# Patient Record
Sex: Female | Born: 1955
Health system: Southern US, Community
[De-identification: ages and names within clinical notes are randomized; demographics above are authoritative.]

## PROBLEM LIST (undated history)

## (undated) DIAGNOSIS — D649 Anemia, unspecified: Secondary | ICD-10-CM

## (undated) DIAGNOSIS — F329 Major depressive disorder, single episode, unspecified: Secondary | ICD-10-CM

## (undated) DIAGNOSIS — E119 Type 2 diabetes mellitus without complications: Secondary | ICD-10-CM

## (undated) DIAGNOSIS — F32A Depression, unspecified: Secondary | ICD-10-CM

## (undated) DIAGNOSIS — M1990 Unspecified osteoarthritis, unspecified site: Secondary | ICD-10-CM

## (undated) DIAGNOSIS — H269 Unspecified cataract: Secondary | ICD-10-CM

## (undated) DIAGNOSIS — T7840XA Allergy, unspecified, initial encounter: Secondary | ICD-10-CM

## (undated) DIAGNOSIS — I10 Essential (primary) hypertension: Secondary | ICD-10-CM

## (undated) DIAGNOSIS — F419 Anxiety disorder, unspecified: Secondary | ICD-10-CM

## (undated) DIAGNOSIS — E785 Hyperlipidemia, unspecified: Secondary | ICD-10-CM

## (undated) HISTORY — DX: Anemia, unspecified: D64.9

## (undated) HISTORY — DX: Hyperlipidemia, unspecified: E78.5

## (undated) HISTORY — DX: Depression, unspecified: F32.A

## (undated) HISTORY — DX: Essential (primary) hypertension: I10

## (undated) HISTORY — DX: Allergy, unspecified, initial encounter: T78.40XA

## (undated) HISTORY — PX: TUBAL LIGATION: SHX77

## (undated) HISTORY — PX: UPPER GASTROINTESTINAL ENDOSCOPY: SHX188

## (undated) HISTORY — DX: Unspecified osteoarthritis, unspecified site: M19.90

## (undated) HISTORY — DX: Type 2 diabetes mellitus without complications: E11.9

## (undated) HISTORY — DX: Anxiety disorder, unspecified: F41.9

## (undated) HISTORY — PX: COLONOSCOPY: SHX174

## (undated) HISTORY — DX: Unspecified cataract: H26.9

---
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
## 1984-02-09 HISTORY — PX: SHOULDER SURGERY: SHX246

## 2018-08-25 ENCOUNTER — Other Ambulatory Visit: Payer: Self-pay

## 2018-08-25 ENCOUNTER — Encounter: Payer: Self-pay | Admitting: Family Medicine

## 2018-08-25 ENCOUNTER — Ambulatory Visit (INDEPENDENT_AMBULATORY_CARE_PROVIDER_SITE_OTHER): Payer: Self-pay | Admitting: Family Medicine

## 2018-08-25 VITALS — BP 110/74 | HR 74 | Wt 135.0 lb

## 2018-08-25 DIAGNOSIS — I10 Essential (primary) hypertension: Secondary | ICD-10-CM

## 2018-08-25 DIAGNOSIS — F325 Major depressive disorder, single episode, in full remission: Secondary | ICD-10-CM

## 2018-08-25 DIAGNOSIS — Z1211 Encounter for screening for malignant neoplasm of colon: Secondary | ICD-10-CM

## 2018-08-25 DIAGNOSIS — E119 Type 2 diabetes mellitus without complications: Secondary | ICD-10-CM

## 2018-08-25 DIAGNOSIS — Z114 Encounter for screening for human immunodeficiency virus [HIV]: Secondary | ICD-10-CM

## 2018-08-25 DIAGNOSIS — Z1239 Encounter for other screening for malignant neoplasm of breast: Secondary | ICD-10-CM

## 2018-08-25 DIAGNOSIS — Z1159 Encounter for screening for other viral diseases: Secondary | ICD-10-CM

## 2018-08-25 MED ORDER — CITALOPRAM HYDROBROMIDE 20 MG PO TABS: 20.0000 mg | ORAL_TABLET | Freq: Every day | ORAL | 0 refills | Status: DC

## 2018-08-25 MED ORDER — LOSARTAN POTASSIUM 100 MG PO TABS: 100.0000 mg | ORAL_TABLET | Freq: Every day | ORAL | 3 refills | Status: DC

## 2018-08-25 MED ORDER — METFORMIN HCL 500 MG PO TABS: 500.0000 mg | ORAL_TABLET | Freq: Two times a day (BID) | ORAL | 3 refills | Status: DC

## 2018-08-25 NOTE — Progress Notes (Signed)
Subjective:   Patient ID: Stacy Nguyen    DOB: 03/15/1955, 63 y.o. female   MRN: [id number]  Stacy Nguyen is a 63 y.o. female with a history of T2DM, HTN here for   Establish Care - [place] to [place] closer to [place]. Staying with son and his wife. - works at [employer], works for [employer]. line work.  - T2DM - metformin  - HTN - losartan, amlodipine,  - depression - on citalopram 20mg  since ~2000. Was put on after divorce. Has tried to get off it before but made her dizzy. Never hospitalized. No psychiatry.  - no abnl mammogram or colonoscopy, due for repeat screenings - no alcohol, never smoker, no ID use  Diabetes, Type 2 - Last A1c none since last year, thinks it was in the 6s.  - Medications: metformin 500mg  BID - Compliance: good - Checking BG at home: yes, 120s - Diet: lots of fruits and vegetables. Eats 3 meals per day.  - Exercise: walks a lot at work - Eye exam: 2 years ago. Wears glasses. - Foot exam: due - Microalbumin: n/a - Statin: no - Denies symptoms of hypoglycemia, polyuria, polydipsia, numbness extremities, foot ulcers/trauma  Hypertension: - Medications: losartan 100mg , amlodipine 5mg  - Compliance: good - Checking BP at home: yes, 110-120s SBP - Denies any SOB, CP, vision changes, LE edema, medication SEs, or symptoms of hypotension - Diet: see above - Exercise: see above  Review of Systems:  Per HPI.  PMFSH, medications and smoking status reviewed.  Objective:   BP 110/74   Pulse 74   Wt 135 lb (61.2 kg)  Vitals and nursing note reviewed.  General: well nourished, well developed, in no acute distress with non-toxic appearance CV: regular rate and rhythm without murmurs, rubs, or gallops, no lower extremity edema Lungs: clear to auscultation bilaterally with normal work of breathing Skin: warm, dry, no rashes or lesions. Foot exam done without abnormalities. Extremities: warm and well perfused,  normal tone MSK: ROM grossly intact, strength intact, gait normal Neuro: Alert and oriented, speech normal  Assessment & Plan:   Diabetes mellitus without complication (HCC) As lab is closed, will have patient come for lab appt later for A1c and lipid panel. Refills provided for medications today. Foot exam done without abnormalities. Referral made for diabetic eye exam. Revisit statin use at f/u. F/u in one month.  HTN (hypertension) Low normal today with intermittent symptoms of dizziness. Will d/c amlodipine. Will obtain BMP. F/u in one month.  Health Maintenance Orders placed for mammogram and colonoscopy. Will obtain HIV, Hep C screening.  Orders Placed This Encounter  Procedures  . MM DIGITAL SCREENING BILATERAL    Standing Status:   Future    Standing Expiration Date:   08/25/2019    Order Specific Question:   Reason for Exam (SYMPTOM  OR DIAGNOSIS REQUIRED)    Answer:   screening for breast cancer    Order Specific Question:   Preferred imaging location?    [facility]  . Lipid Panel    Standing Status:   Future    Standing Expiration Date:   08/25/2019  . CBC    Standing Status:   Future    Standing Expiration Date:   08/25/2019  . Basic Metabolic Panel    Standing Status:   Future    Standing Expiration Date:   08/25/2019  . Hepatitis C antibody    Standing Status:  Future    Standing Expiration Date:   08/25/2019  . HIV antibody (with reflex)    Standing Status:   Future    Standing Expiration Date:   08/25/2019  . Ambulatory referral to Gastroenterology    Referral Priority:   Routine    Referral Type:   Consultation    Referral Reason:   Specialty Services Required    Number of Visits Requested:   1  . Ambulatory referral to Ophthalmology    Referral Priority:   Routine    Referral Type:   Consultation    Referral Reason:   Specialty Services Required    Requested Specialty:   Ophthalmology    Number of Visits Requested:   1   Meds ordered  this encounter  Medications  . metFORMIN (GLUCOPHAGE) 500 MG tablet    Sig: Take 1 tablet (500 mg total) by mouth 2 (two) times daily with a meal.    Dispense:  180 tablet    Refill:  3  . losartan (COZAAR) 100 MG tablet    Sig: Take 1 tablet (100 mg total) by mouth at bedtime.    Dispense:  90 tablet    Refill:  3  . citalopram (CELEXA) 20 MG tablet    Sig: Take 1 tablet (20 mg total) by mouth daily.    Dispense:  30 tablet    Refill:  0    Rory Percy, DO PGY-3, [facility] 08/25/2018 9:53 PM

## 2018-08-25 NOTE — Assessment & Plan Note (Addendum)
Low normal today with intermittent symptoms of dizziness. Will d/c amlodipine. Will obtain BMP. F/u in one month.

## 2018-08-25 NOTE — Patient Instructions (Signed)
It was great to see you!  Our plans for today:  - we refilled your medications.  - Stop taking the amlodipine. - come for a lab visit to get labs. - We placed orders for mammogram and colonoscopy - come back in one month.  Take care and seek immediate care sooner if you develop any concerns.   Dr. [facility]

## 2018-08-25 NOTE — Assessment & Plan Note (Signed)
As lab is closed, will have patient come for lab appt later for A1c and lipid panel. Refills provided for medications today. Foot exam done without abnormalities. Referral made for diabetic eye exam. Revisit statin use at f/u. F/u in one month.

## 2018-09-14 ENCOUNTER — Other Ambulatory Visit: Payer: Self-pay

## 2018-09-14 DIAGNOSIS — F325 Major depressive disorder, single episode, in full remission: Secondary | ICD-10-CM

## 2018-09-14 DIAGNOSIS — I10 Essential (primary) hypertension: Secondary | ICD-10-CM

## 2018-09-14 DIAGNOSIS — E119 Type 2 diabetes mellitus without complications: Secondary | ICD-10-CM

## 2018-09-14 DIAGNOSIS — Z114 Encounter for screening for human immunodeficiency virus [HIV]: Secondary | ICD-10-CM

## 2018-09-14 DIAGNOSIS — Z1159 Encounter for screening for other viral diseases: Secondary | ICD-10-CM

## 2018-09-15 LAB — HEPATITIS C ANTIBODY: Hep C Virus Ab: 0.1 s/co ratio (ref 0.0–0.9)

## 2018-09-15 LAB — BASIC METABOLIC PANEL
BUN/Creatinine Ratio: 15 (ref 12–28)
BUN: 13 mg/dL (ref 8–27)
CO2: 26 mmol/L (ref 20–29)
Calcium: 9.8 mg/dL (ref 8.7–10.3)
Chloride: 104 mmol/L (ref 96–106)
Creatinine, Ser: 0.86 mg/dL (ref 0.57–1.00)
GFR calc Af Amer: 83 mL/min/{1.73_m2} (ref >59)
GFR calc non Af Amer: 72 mL/min/{1.73_m2} (ref >59)
Glucose: 90 mg/dL (ref 65–99)
Potassium: 4.5 mmol/L (ref 3.5–5.2)
Sodium: 144 mmol/L (ref 134–144)

## 2018-09-15 LAB — LIPID PANEL
Chol/HDL Ratio: 3.1 ratio (ref 0.0–4.4)
Cholesterol, Total: 170 mg/dL (ref 100–199)
HDL: 55 mg/dL (ref >39)
LDL Calculated: 106 mg/dL — ABNORMAL HIGH (ref 0–99)
Triglycerides: 44 mg/dL (ref 0–149)
VLDL Cholesterol Cal: 9 mg/dL (ref 5–40)

## 2018-09-15 LAB — CBC
Hematocrit: 40.6 % (ref 34.0–46.6)
Hemoglobin: 12.9 g/dL (ref 11.1–15.9)
MCH: 27 pg (ref 26.6–33.0)
MCHC: 31.8 g/dL (ref 31.5–35.7)
MCV: 85 fL (ref 79–97)
Platelets: 178 10*3/uL (ref 150–450)
RBC: 4.78 x10E6/uL (ref 3.77–5.28)
RDW: 12.9 % (ref 11.7–15.4)
WBC: 4 10*3/uL (ref 3.4–10.8)

## 2018-09-15 LAB — HIV ANTIBODY (ROUTINE TESTING W REFLEX): HIV Screen 4th Generation wRfx: NONREACTIVE

## 2018-09-15 MED ORDER — ROSUVASTATIN CALCIUM 5 MG PO TABS: 5.0000 mg | ORAL_TABLET | Freq: Every day | ORAL | 3 refills | Status: DC

## 2018-09-15 NOTE — Addendum Note (Signed)
Addended by: Myles Gip on: 09/15/2018 04:37 PM   Modules accepted: Orders

## 2018-09-18 ENCOUNTER — Telehealth: Payer: Self-pay | Admitting: *Deleted

## 2018-09-18 ENCOUNTER — Other Ambulatory Visit: Payer: Self-pay | Admitting: Family Medicine

## 2018-09-18 MED ORDER — ATORVASTATIN CALCIUM 10 MG PO TABS: 10.0000 mg | ORAL_TABLET | Freq: Every day | ORAL | 3 refills | Status: DC

## 2018-09-18 NOTE — Telephone Encounter (Signed)
Pt calls back because the cholesterol med is "way too expensive since I dont have insurance".  She wants to know if there is something else she can do until she is able get insurance.  She states if she does not answer to please leave a message.  Christen Bame, CMA

## 2018-09-18 NOTE — Telephone Encounter (Signed)
Notes recorded by Valerie Roys, CMA on 09/18/2018 at 9:33 AM EDT  LM for patient to call back. Jazmin Hartsell,CMA   ------   Notes recorded by Rory Percy, DO on 09/15/2018 at 4:36 PM EDT  Left VM regarding results. Everything is normal except for her lipid panel is a little elevated. Based on her risk factors, it is recommended to start a statin. I will send this in to the pharmacy.

## 2018-09-18 NOTE — Telephone Encounter (Signed)
Spoke to pt. Changed to atorvastatin which is on [facility] $4 list.

## 2018-09-21 NOTE — Telephone Encounter (Signed)
Pt wonders if she can improve her LDL without taking atorvastatin.  She wonders if she started taking CoQ10 again if that would help.  To PCP to advise. Christen Bame, CMA

## 2018-09-22 NOTE — Telephone Encounter (Signed)
Attempted for 2nd time to reach pt. LVM of note that was left. Salvatore Marvel, CMA

## 2018-09-22 NOTE — Telephone Encounter (Signed)
Left VM explaining we don't have much data on CoQ10 in hyperlipidemia but the information we do have in small studies suggests it does not provide much benefit. The medications shown to have the most benefit to lowering cholesterol are statins. Asks to call the clinic if she has further questions. Happy to discuss with her.

## 2018-10-12 ENCOUNTER — Encounter: Payer: Self-pay | Admitting: Family Medicine

## 2018-10-12 ENCOUNTER — Other Ambulatory Visit: Payer: Self-pay

## 2018-10-12 ENCOUNTER — Ambulatory Visit (INDEPENDENT_AMBULATORY_CARE_PROVIDER_SITE_OTHER): Payer: PRIVATE HEALTH INSURANCE | Admitting: Family Medicine

## 2018-10-12 VITALS — BP 112/60 | HR 72 | Ht 63.0 in | Wt 133.0 lb

## 2018-10-12 DIAGNOSIS — E785 Hyperlipidemia, unspecified: Secondary | ICD-10-CM | POA: Insufficient documentation

## 2018-10-12 DIAGNOSIS — I10 Essential (primary) hypertension: Secondary | ICD-10-CM

## 2018-10-12 DIAGNOSIS — E119 Type 2 diabetes mellitus without complications: Secondary | ICD-10-CM | POA: Diagnosis not present

## 2018-10-12 LAB — POCT GLYCOSYLATED HEMOGLOBIN (HGB A1C): HbA1c, POC (controlled diabetic range): 5.9 % (ref 0.0–7.0)

## 2018-10-12 MED ORDER — METFORMIN HCL 500 MG PO TABS: 500.0000 mg | ORAL_TABLET | Freq: Every day | ORAL | 3 refills | Status: DC

## 2018-10-12 NOTE — Assessment & Plan Note (Signed)
Below goal today, A1c 5.9.  Will decrease metformin to 500 mg once daily.  Will recheck A1c in 6 months.

## 2018-10-12 NOTE — Assessment & Plan Note (Signed)
Started on statin a few days ago.  Tolerating okay.  Will recheck lipid panel and LFTs in 6 weeks.

## 2018-10-12 NOTE — Patient Instructions (Addendum)
It was great to see you!   Our plans for today:  -Stop taking the amlodipine. -Only take metformin 1 tablet once daily.  Recheck your A1c in 6 months. -Continue taking the cholesterol medication.  We will recheck your labs in about 6 weeks. -We will place an referral for an eye doctor, someone will call you with this appointment.  Take care and seek immediate care sooner if you develop any concerns.   Dr. [facility]

## 2018-10-12 NOTE — Progress Notes (Signed)
  Subjective:   Patient ID: Jadah Bobak    DOB: [birth year] diabetes 63 y.o. female   MRN: [id number]  Sophy Kiaraliz Rafuse is a 63 y.o. female with a history of HTN, diabetes here for   Diabetes, Type 2 - Last A1c unknown, thinks it was in the 6s. - Medications: Metformin 500 mg twice daily - Compliance: good - Checking BG at home: yes, 100-120s. Highest she has seen is 192. - Diet: 3 meals per day. Eating fruits and vegetables. Does drink a lot of juice. - Exercise: No formal exercise. Active during the day at work. Works at [employer].  Is on her feet a lot and loads boxes. - Eye exam: Ordered - Foot exam: UTD - Microalbumin: N/a - Statin: yes, just started taking a few days ago - Denies symptoms of hypoglycemia, polyuria, polydipsia, numbness extremities, foot ulcers/trauma  Hypertension: - Medications: Losartan 100 mg - Compliance: Has still been taking amlodipine 5 mg despite discontinuation at last visit - Checking BP at home: Yes, 110s SBP - Denies any SOB, CP, vision changes, LE edema, medication SEs, or symptoms of hypotension - Diet: see above - Exercise: see above  Review of Systems:  Per HPI.  [place], medications and smoking status reviewed.  Objective:   BP 112/60   Pulse 72   Ht 5\' 3"  (1.6 m)   Wt 133 lb (60.3 kg)   SpO2 97%   BMI 23.56 kg/m  Vitals and nursing note reviewed.  General: well nourished, well developed, in no acute distress with non-toxic appearance CV: regular rate and rhythm without murmurs, rubs, or gallops, no lower extremity edema Lungs: clear to auscultation bilaterally with normal work of breathing Skin: warm, dry, no rashes or lesions Extremities: warm and well perfused, normal tone MSK: ROM grossly intact, strength intact, gait normal Neuro: Alert and oriented, speech normal  Assessment & Plan:   Diabetes mellitus without complication (HCC) Below goal today, A1c 5.9.  Will decrease metformin to 500 mg once daily.   Will recheck A1c in 6 months.  HTN (hypertension) Continues to be low normal with intermittent symptoms of postural dizziness, patient did not discontinue amlodipine as discussed at prior visit.  Will have patient discontinue amlodipine today.  Follow-up in 6 weeks.  Hyperlipidemia Started on statin a few days ago.  Tolerating okay.  Will recheck lipid panel and LFTs in 6 weeks.  Orders Placed This Encounter  Procedures  . HgB A1c   Meds ordered this encounter  Medications  . metFORMIN (GLUCOPHAGE) 500 MG tablet    Sig: Take 1 tablet (500 mg total) by mouth daily with breakfast.    Dispense:  180 tablet    Refill:  Camp Dennison, DO PGY-3, [facility] 10/12/2018 4:08 PM

## 2018-10-12 NOTE — Assessment & Plan Note (Signed)
Continues to be low normal with intermittent symptoms of postural dizziness, patient did not discontinue amlodipine as discussed at prior visit.  Will have patient discontinue amlodipine today.  Follow-up in 6 weeks.

## 2018-11-16 ENCOUNTER — Ambulatory Visit
Admission: RE | Admit: 2018-11-16 | Discharge: 2018-11-16 | Disposition: A | Discharge status: Home / Self Care | Payer: PRIVATE HEALTH INSURANCE | Source: Ambulatory Visit | Attending: Family Medicine | Admitting: Family Medicine

## 2018-11-16 ENCOUNTER — Other Ambulatory Visit: Payer: Self-pay

## 2018-11-16 DIAGNOSIS — Z1239 Encounter for other screening for malignant neoplasm of breast: Secondary | ICD-10-CM

## 2018-11-26 NOTE — Progress Notes (Signed)
Subjective:   Patient ID: Stacy Nguyen    DOB: Jun 30, 1955, 63 y.o. female   MRN: [id number]  Stacy Nguyen is a 63 y.o. female with a history of HTN, HLD, type II diabetes here for   Diabetes, Type 2 - Last A1c 5.9 10/2018 - Medications: metformin 500mg  daily - Compliance: good - Checking BG at home: yes, blood sugars 90-180. - Diet: eats 3 meals per day. Eating fruits and vegetables.  - Exercise: walks a lot - Eye exam: due, ordered - Foot exam: UTD - Microalbumin: Due - Statin: yes - Pneumococcal vaccine: yes - Denies symptoms of hypoglycemia, polyuria, polydipsia, numbness extremities, foot ulcers/trauma  Hypertension: - Medications: losartan 100mg  daily - Compliance: good - Checking BP at home: yes 109-127 SBP - Denies any SOB, CP, vision changes, LE edema, medication SEs - some lightheadedness with walking. - Diet: see above - Exercise: see above  HLD - medications: Atorvastatin 10 mg daily - compliance: Good - medication SEs: None  Healthcare Maintenance - Vaccines: flu, tdap - Colonoscopy: due, referral placed - Mammogram: UTD - Pap Smear: due, never had abnormal - DEXA Scan: due - Lipid Panel: UTD   Review of Systems:  Per HPI.  Medications and smoking status reviewed.  Objective:   BP 122/60   Pulse 73   Ht 5\' 3"  (1.6 m)   Wt 136 lb (61.7 kg)   SpO2 97%   BMI 24.09 kg/m  Vitals and nursing note reviewed.  General: well nourished, well developed, elderly female in no acute distress with non-toxic appearance CV: no lower extremity edema Lungs: normal work of breathing Skin: warm, dry, no rashes or lesions  Assessment & Plan:   Diabetes mellitus without complication (HCC) V3X 5.6 today.  CBGs well-controlled, 90-180 without symptoms of hypoglycemia.  Urine microalbumin normal today.  Will continue current regimen for now.  Instructed to call eye doctor for updated eye exam.  Follow-up in 6 months for next A1c.  HTN  (hypertension) Continues with low normal blood pressures, frequently in 100s-110s with orthostatic symptoms.  Will decrease losartan to 50 mg daily.  Follow-up in 2 months for recheck.  Hyperlipidemia Started on statin at last visit.  Will obtain repeat lipid panel and LFTs today.  Healthcare maintenance Instructed to call and make appointments to update eye exam and colonoscopy, referral already placed.  Order placed for DEXA scan today.  Discussed cervical cancer screening, patient opted to update Pap smear, instructed to make appointment at next convenience.  Previously declined flu vaccine.  Orders Placed This Encounter  Procedures  . DG Bone Density    Standing Status:   Future    Standing Expiration Date:   01/26/2020    Order Specific Question:   Reason for Exam (SYMPTOM  OR DIAGNOSIS REQUIRED)    Answer:   estrogen deficiency    Order Specific Question:   Preferred imaging location?    Answer:   [facility]  . Lipid Panel    Order Specific Question:   Has the patient fasted?    Answer:   No  . Comprehensive metabolic panel    Order Specific Question:   Has the patient fasted?    Answer:   No  . POCT UA - Microalbumin  . HgB A1c   Meds ordered this encounter  Medications  . losartan (COZAAR) 50 MG tablet    Sig: Take 1 tablet (50 mg total) by mouth daily.    Dispense:  90 tablet  Refill:  3    Ellwood Dense, DO PGY-3, [facility] 11/27/2018 5:55 PM

## 2018-11-27 ENCOUNTER — Other Ambulatory Visit: Payer: Self-pay

## 2018-11-27 ENCOUNTER — Encounter: Payer: Self-pay | Admitting: Family Medicine

## 2018-11-27 ENCOUNTER — Ambulatory Visit (INDEPENDENT_AMBULATORY_CARE_PROVIDER_SITE_OTHER): Payer: PRIVATE HEALTH INSURANCE | Admitting: Family Medicine

## 2018-11-27 VITALS — BP 122/60 | HR 73 | Ht 63.0 in | Wt 136.0 lb

## 2018-11-27 DIAGNOSIS — I10 Essential (primary) hypertension: Secondary | ICD-10-CM

## 2018-11-27 DIAGNOSIS — E119 Type 2 diabetes mellitus without complications: Secondary | ICD-10-CM

## 2018-11-27 DIAGNOSIS — E785 Hyperlipidemia, unspecified: Secondary | ICD-10-CM | POA: Diagnosis not present

## 2018-11-27 DIAGNOSIS — Z0000 Encounter for general adult medical examination without abnormal findings: Secondary | ICD-10-CM | POA: Diagnosis not present

## 2018-11-27 LAB — POCT UA - MICROALBUMIN
Albumin/Creatinine Ratio, Urine, POC: <30
Creatinine, POC: 200 mg/dL
Microalbumin Ur, POC: 20 mg/L

## 2018-11-27 LAB — POCT GLYCOSYLATED HEMOGLOBIN (HGB A1C): HbA1c, POC (controlled diabetic range): 5.6 % (ref 0.0–7.0)

## 2018-11-27 MED ORDER — LOSARTAN POTASSIUM 50 MG PO TABS: 50.0000 mg | ORAL_TABLET | Freq: Every day | ORAL | 3 refills | Status: DC

## 2018-11-27 NOTE — Assessment & Plan Note (Addendum)
Instructed to call and make appointments to update eye exam and colonoscopy, referral already placed.  Order placed for DEXA scan today.  Discussed cervical cancer screening, patient opted to update Pap smear, instructed to make appointment at next convenience.  Previously declined flu vaccine.

## 2018-11-27 NOTE — Assessment & Plan Note (Signed)
Started on statin at last visit.  Will obtain repeat lipid panel and LFTs today.

## 2018-11-27 NOTE — Assessment & Plan Note (Addendum)
A1c 5.6 today.  CBGs well-controlled, 90-180 without symptoms of hypoglycemia.  Urine microalbumin normal today.  Will continue current regimen for now.  Instructed to call eye doctor for updated eye exam.  Follow-up in 6 months for next A1c.

## 2018-11-27 NOTE — Patient Instructions (Addendum)
It was great to see you!  Our plans for today:  - Call [facility] at  [phone] to schedule your colonoscopy. - Make an appointment to update your pap smear. - Call the eye doctor to schedule an eye exam. - We are decreasing your losartan to 75mg  daily. - We are updating your bone density scan to check for osteoporosis.   We are checking some labs today, we will call you or send you a letter if they are abnormal.   Take care and seek immediate care sooner if you develop any concerns.   Dr. [facility]

## 2018-11-27 NOTE — Assessment & Plan Note (Signed)
Continues with low normal blood pressures, frequently in 100s-110s with orthostatic symptoms.  Will decrease losartan to 50 mg daily.  Follow-up in 2 months for recheck.

## 2018-11-28 ENCOUNTER — Telehealth: Payer: Self-pay | Admitting: Family Medicine

## 2018-11-28 ENCOUNTER — Encounter: Payer: Self-pay | Admitting: Family Medicine

## 2018-11-28 LAB — COMPREHENSIVE METABOLIC PANEL
ALT: 13 IU/L (ref 0–32)
AST: 14 IU/L (ref 0–40)
Albumin/Globulin Ratio: 2 (ref 1.2–2.2)
Albumin: 4.2 g/dL (ref 3.8–4.8)
Alkaline Phosphatase: 69 IU/L (ref 39–117)
BUN/Creatinine Ratio: 19 (ref 12–28)
BUN: 19 mg/dL (ref 8–27)
Bilirubin Total: 0.2 mg/dL (ref 0.0–1.2)
CO2: 27 mmol/L (ref 20–29)
Calcium: 9.5 mg/dL (ref 8.7–10.3)
Chloride: 105 mmol/L (ref 96–106)
Creatinine, Ser: 0.98 mg/dL (ref 0.57–1.00)
GFR calc Af Amer: 71 mL/min/{1.73_m2} (ref >59)
GFR calc non Af Amer: 61 mL/min/{1.73_m2} (ref >59)
Globulin, Total: 2.1 g/dL (ref 1.5–4.5)
Glucose: 95 mg/dL (ref 65–99)
Potassium: 4.8 mmol/L (ref 3.5–5.2)
Sodium: 141 mmol/L (ref 134–144)
Total Protein: 6.3 g/dL (ref 6.0–8.5)

## 2018-11-28 LAB — LIPID PANEL
Chol/HDL Ratio: 2.4 ratio (ref 0.0–4.4)
Cholesterol, Total: 130 mg/dL (ref 100–199)
HDL: 54 mg/dL (ref >39)
LDL Chol Calc (NIH): 66 mg/dL (ref 0–99)
Triglycerides: 41 mg/dL (ref 0–149)
VLDL Cholesterol Cal: 10 mg/dL (ref 5–40)

## 2018-11-28 NOTE — Telephone Encounter (Signed)
Patient stopped by seen was seen yesterday on 11/27/18.  She needs note to go back to work.  She saw Dr. Ky Barban on Monday.  Also she needs print out of form at the end of her visit she left it behind.  Patient's [phone].

## 2018-11-28 NOTE — Telephone Encounter (Signed)
Will forward to Md. Lindsee Labarre,CMA  

## 2018-11-28 NOTE — Telephone Encounter (Signed)
Letters written and sent to admin pool for printing and pickup by patient.

## 2018-11-29 NOTE — Telephone Encounter (Signed)
Lm for patient that letter will be ready for pick up at the front desk later today.  Jazmin Hartsell,CMA

## 2018-12-12 ENCOUNTER — Encounter: Payer: Self-pay | Admitting: Gastroenterology

## 2018-12-28 ENCOUNTER — Other Ambulatory Visit: Payer: Self-pay

## 2018-12-28 ENCOUNTER — Ambulatory Visit (AMBULATORY_SURGERY_CENTER): Payer: Self-pay | Admitting: *Deleted

## 2018-12-28 VITALS — Temp 97.5°F | Ht 61.0 in | Wt 132.2 lb

## 2018-12-28 DIAGNOSIS — Z1211 Encounter for screening for malignant neoplasm of colon: Secondary | ICD-10-CM

## 2018-12-28 DIAGNOSIS — Z1159 Encounter for screening for other viral diseases: Secondary | ICD-10-CM

## 2018-12-28 MED ORDER — SUPREP BOWEL PREP KIT 17.5-3.13-1.6 GM/177ML PO SOLN: 1.0000 | Freq: Once | ORAL | 0 refills | Status: AC

## 2018-12-28 NOTE — Progress Notes (Signed)
Patient denies any allergies to egg or soy products. Patient denies complications with anesthesia/sedation.  Patient denies oxygen use at home and denies diet medications. Emmi instructions for colonoscopy/endoscopy explained and given to patient.  Suprep coupon given.   

## 2019-01-08 ENCOUNTER — Other Ambulatory Visit (HOSPITAL_COMMUNITY)
Admission: RE | Admit: 2019-01-08 | Discharge: 2019-01-08 | Disposition: A | Discharge status: Home / Self Care | Payer: PRIVATE HEALTH INSURANCE | Source: Ambulatory Visit | Attending: Family Medicine | Admitting: Family Medicine

## 2019-01-08 ENCOUNTER — Other Ambulatory Visit: Payer: Self-pay

## 2019-01-08 ENCOUNTER — Encounter: Payer: Self-pay | Admitting: Family Medicine

## 2019-01-08 ENCOUNTER — Ambulatory Visit (INDEPENDENT_AMBULATORY_CARE_PROVIDER_SITE_OTHER): Payer: PRIVATE HEALTH INSURANCE | Admitting: Family Medicine

## 2019-01-08 VITALS — BP 130/70 | HR 69 | Ht 61.0 in | Wt 133.2 lb

## 2019-01-08 DIAGNOSIS — Z124 Encounter for screening for malignant neoplasm of cervix: Secondary | ICD-10-CM | POA: Diagnosis not present

## 2019-01-08 DIAGNOSIS — Z0000 Encounter for general adult medical examination without abnormal findings: Secondary | ICD-10-CM | POA: Diagnosis not present

## 2019-01-08 NOTE — Patient Instructions (Signed)
It was great to see you!  Our plans for today:  - We updated your Pap smear today, we will call you or send you a letter with these results. - I will check with our lab technician about insurance coverage for a stool card to check for colon cancer. - It would be a good idea to reach out to the Social Security office to see about signing up for Medicare once you turn 63 years old.  Take care and seek immediate care sooner if you develop any concerns.   Dr. [facility]

## 2019-01-08 NOTE — Progress Notes (Signed)
  Subjective:   Patient ID: Stacy Nguyen    DOB: 03-05-1955, 63 y.o. female   MRN: [id number]  Stacy Nguyen is a 63 y.o. female with a history of HTN, diabetes, HLD here for   Need for pap smear - [id number] - Menses: postmenopausal - Contraception: n/a - Cancer screening: due, denies previous abnormal - no abnormal discharge or bleeding.  - not sexually active.   Healthcare Maintenance - Vaccines: tdap, flu (declined) - Colonoscopy: due - Mammogram: UTD - Pap Smear: due - DEXA Scan: ordered  Review of Systems:  Per HPI.  Medications and smoking status reviewed.  Objective:   BP 130/70   Pulse 69   Ht 5\' 1"  (1.549 m)   Wt 133 lb 4 oz (60.4 kg)   LMP  (LMP Unknown)   BMI 25.18 kg/m  Vitals and nursing note reviewed.  General: well nourished, well developed, in no acute distress with non-toxic appearance GYN:  External genitalia within normal limits.  Vaginal mucosa pink, moist, normal rugae.  Nonfriable, atrophic cervix without lesions, no discharge or bleeding noted on speculum exam.  Bimanual exam revealed normal, nongravid uterus.  No cervical motion tenderness. No adnexal masses bilaterally.    Assessment & Plan:   Healthcare maintenance Pap smear obtained today, will call with results. Patient reports difficulties obtaining colonoscopy due to poor insurance coverage. Will reach out to lab tech to see if insurance will cover annual FOBT. Advised patient to apply for Medicare a few months before turning 64yo. DEXA ordered.   No orders of the defined types were placed in this encounter.  No orders of the defined types were placed in this encounter.   Rory Percy, DO PGY-3, [facility] 01/08/2019 6:00 PM

## 2019-01-08 NOTE — Assessment & Plan Note (Signed)
Pap smear obtained today, will call with results. Patient reports difficulties obtaining colonoscopy due to poor insurance coverage. Will reach out to lab tech to see if insurance will cover annual FOBT. Advised patient to apply for Medicare a few months before turning 64yo. DEXA ordered.

## 2019-01-10 LAB — CYTOLOGY - PAP
Comment: NEGATIVE
Diagnosis: NEGATIVE
High risk HPV: NEGATIVE

## 2019-01-11 ENCOUNTER — Encounter: Payer: PRIVATE HEALTH INSURANCE | Admitting: Gastroenterology

## 2019-01-18 ENCOUNTER — Other Ambulatory Visit: Payer: Self-pay | Admitting: Family Medicine

## 2019-01-19 NOTE — Telephone Encounter (Signed)
Pt returning call to Dr. Ky Barban.   States that Dr. Ky Barban wanted to know if she was taking citalopram.  Pt states that she is and would like a refill.  Christen Bame, CMA

## 2019-02-08 ENCOUNTER — Ambulatory Visit
Admission: RE | Admit: 2019-02-08 | Discharge: 2019-02-08 | Disposition: A | Discharge status: Home / Self Care | Payer: PRIVATE HEALTH INSURANCE | Source: Ambulatory Visit | Attending: Family Medicine | Admitting: Family Medicine

## 2019-02-08 ENCOUNTER — Other Ambulatory Visit: Payer: Self-pay

## 2019-02-08 DIAGNOSIS — E119 Type 2 diabetes mellitus without complications: Secondary | ICD-10-CM

## 2019-02-13 ENCOUNTER — Telehealth (INDEPENDENT_AMBULATORY_CARE_PROVIDER_SITE_OTHER): Payer: PRIVATE HEALTH INSURANCE | Admitting: Family Medicine

## 2019-02-13 ENCOUNTER — Other Ambulatory Visit: Payer: Self-pay

## 2019-02-13 DIAGNOSIS — R519 Headache, unspecified: Secondary | ICD-10-CM

## 2019-02-13 DIAGNOSIS — Z20822 Contact with and (suspected) exposure to covid-19: Secondary | ICD-10-CM | POA: Insufficient documentation

## 2019-02-13 DIAGNOSIS — R0989 Other specified symptoms and signs involving the circulatory and respiratory systems: Secondary | ICD-10-CM

## 2019-02-13 DIAGNOSIS — R6883 Chills (without fever): Secondary | ICD-10-CM | POA: Diagnosis not present

## 2019-02-13 NOTE — Progress Notes (Signed)
[facility] Telemedicine Visit  Patient consented to have virtual visit. Method of visit: Telephone  Encounter participants: Patient: Stacy Nguyen - located at home  Provider: Derrel Nip - located at home    Chief Complaint: cold symptoms   HPI:  Patient reports that symptoms started yesterday morning 1/4.  They consist of a runny nose, chills and a headache.  She says that with the chills she is thought that she had a fever but has checked her temperature and it is always been around 98 F.  Patient denies any sore throat, cough, nausea, vomiting, diarrhea, constipation, shortness of breath, body aches, loss of taste or smell.  She says that she has been treating this by drinking plenty of fluids including water and emergency vitamin mix.  She is also been taking Tylenol 1000 mg every 6 hours as needed as well as Coricidin HBP and Zicam nasal swabs.  Patient denies any recent sick contacts although she notes she works at [employer] and is not sure if anyone there is sick.  Patient lives at home with her son and daughter-in-law as well as grandchild.  No one else in the house is sick at this time.  Patient reports she is quarantining in her room at this time.  ROS: per HPI  Pertinent PMHx: Hypertension  Exam:  Respiratory: Speaking in complete sentences, no noticeable shortness of breath.  She does have what sounds like nasal congestion  Assessment/Plan:  Suspected COVID-19 virus infection Patient with symtpoms concerning for covid-19. Patient reports runny nose, chills, headache.  Denies sore throat, cough, nausea, vomiting, diarrhea, constipation, shortness of breath, body aches, loss of taste and smell.  Patient works at [employer] and is unsure if she has had any sick contacts but notes that she does not know of any.  Would be reasonable to test given symptoms.   -Counseled to continue symptomatic management with Tylenol and  Coricidin. -Recommended discontinuing Zicam nasal swabs -Ordered [id number] for testing and instructed patient to call to schedule COVID test  -Counseled on wearing a mask, washing hands and avoiding social gatherings  -ED precautions discussed and patient expressed good understanding -Patient instructed to avoid others until they meet criteria for ending isolation after any suspected COVID, which are:  -24 hours with no fever (without use of medicaitons) and -respiratory symptoms have improved (e.g. cough, shortness of breath) or  -10 days since symptoms first appeared       Time spent during visit with patient: 22 minutes

## 2019-02-13 NOTE — Assessment & Plan Note (Signed)
Patient with symtpoms concerning for covid-19. Patient reports runny nose, chills, headache.  Denies sore throat, cough, nausea, vomiting, diarrhea, constipation, shortness of breath, body aches, loss of taste and smell.  Patient works at [employer] and is unsure if she has had any sick contacts but notes that she does not know of any.  Would be reasonable to test given symptoms.   -Counseled to continue symptomatic management with Tylenol and Coricidin. -Recommended discontinuing Zicam nasal swabs -Ordered [id number] for testing and instructed patient to call to schedule COVID test  -Counseled on wearing a mask, washing hands and avoiding social gatherings  -ED precautions discussed and patient expressed good understanding -Patient instructed to avoid others until they meet criteria for ending isolation after any suspected COVID, which are:  -24 hours with no fever (without use of medicaitons) and -respiratory symptoms have improved (e.g. cough, shortness of breath) or  -10 days since symptoms first appeared

## 2019-02-13 NOTE — Patient Instructions (Signed)
We recommend you undergo testing for COVID19. This is by appointment only. There are three locations and hours vary.   Text "COVID" to [id number] OR log onto [email or web address].  You can also call [phone].   There are three locations for testing, hours vary.   [facility] [place] [address] [place], [place] [address] Hours 8 AM until 3:30 PM  or [facility] [place] ([place]) [address] [place], [place] [address] or [address] (near [address] in [place]) [place], [place] [address]   You should quarantine (isolate at home) until the following criteria are met: At least 10 days since symptoms first appeared and At least 24 hours with no fever without fever-reducing medication and Other symptoms of COVID-19 are improving (Loss of taste and smell may persist for weeks or months after recovery and need not delay the end of isolation)  When to seek emergency medical attention Look for emergency warning signs* for COVID-19. If your or someone you know is showing any of these signs, seek emergency medical care immediately:  Trouble breathing Persistent pain or pressure in the chest New confusion Inability to wake or stay awake Bluish lips or face Inability to tolerate fluids  *This list is not all possible symptoms. We discussed additional symptoms   Call 911 or call ahead to your local emergency facility: Notify the operator that you are seeking care for someone who has or may have COVID-19.  

## 2019-02-14 ENCOUNTER — Ambulatory Visit: Payer: PRIVATE HEALTH INSURANCE | Attending: Internal Medicine

## 2019-02-14 DIAGNOSIS — Z20822 Contact with and (suspected) exposure to covid-19: Secondary | ICD-10-CM

## 2019-02-16 ENCOUNTER — Encounter: Payer: Self-pay | Admitting: Family Medicine

## 2019-02-16 ENCOUNTER — Telehealth: Payer: Self-pay | Admitting: Family Medicine

## 2019-02-16 LAB — NOVEL CORONAVIRUS, NAA: SARS-CoV-2, NAA: NOT DETECTED

## 2019-02-16 NOTE — Telephone Encounter (Signed)
Pt needs a note stating when she can return to work. Pt saw her negative covid results, but needs note for work  Ok to put on [employer]

## 2019-02-25 NOTE — Telephone Encounter (Signed)
Routing to provider who saw patient

## 2019-02-26 ENCOUNTER — Encounter: Payer: Self-pay | Admitting: Family Medicine

## 2019-02-26 NOTE — Progress Notes (Signed)
I sent a return to work note to the patients mychart.

## 2019-07-30 ENCOUNTER — Other Ambulatory Visit: Payer: Self-pay | Admitting: Family Medicine

## 2019-10-16 ENCOUNTER — Other Ambulatory Visit: Payer: Self-pay

## 2019-10-17 MED ORDER — ATORVASTATIN CALCIUM 10 MG PO TABS: 10.0000 mg | ORAL_TABLET | Freq: Every day | ORAL | 3 refills | Status: DC

## 2019-10-17 MED ORDER — CITALOPRAM HYDROBROMIDE 20 MG PO TABS: 20.0000 mg | ORAL_TABLET | Freq: Every day | ORAL | 0 refills | Status: DC

## 2019-10-26 ENCOUNTER — Other Ambulatory Visit: Payer: Self-pay | Admitting: Family Medicine

## 2019-10-26 DIAGNOSIS — Z1231 Encounter for screening mammogram for malignant neoplasm of breast: Secondary | ICD-10-CM

## 2019-11-18 ENCOUNTER — Other Ambulatory Visit: Payer: Self-pay | Admitting: Family Medicine

## 2019-11-20 ENCOUNTER — Other Ambulatory Visit: Payer: Self-pay

## 2019-11-20 ENCOUNTER — Ambulatory Visit
Admission: RE | Admit: 2019-11-20 | Discharge: 2019-11-20 | Disposition: A | Discharge status: Home / Self Care | Payer: Medicare HMO | Source: Ambulatory Visit | Attending: *Deleted | Admitting: *Deleted

## 2019-11-20 DIAGNOSIS — Z1231 Encounter for screening mammogram for malignant neoplasm of breast: Secondary | ICD-10-CM

## 2019-12-03 ENCOUNTER — Other Ambulatory Visit: Payer: Self-pay

## 2019-12-03 MED ORDER — METFORMIN HCL 500 MG PO TABS
500.0000 mg | ORAL_TABLET | Freq: Every day | ORAL | 3 refills | Status: DC
Start: 2019-12-03 — End: 2021-02-18

## 2020-01-21 ENCOUNTER — Other Ambulatory Visit: Payer: Self-pay

## 2020-01-21 ENCOUNTER — Ambulatory Visit
Admission: RE | Admit: 2020-01-21 | Discharge: 2020-01-21 | Disposition: A | Discharge status: Home / Self Care | Payer: Medicare HMO | Source: Ambulatory Visit | Attending: *Deleted | Admitting: *Deleted

## 2020-01-21 DIAGNOSIS — Z1231 Encounter for screening mammogram for malignant neoplasm of breast: Secondary | ICD-10-CM | POA: Diagnosis not present

## 2020-01-21 MED ORDER — LOSARTAN POTASSIUM 50 MG PO TABS
50.0000 mg | ORAL_TABLET | Freq: Every day | ORAL | 3 refills | Status: DC
Start: 2020-01-21 — End: 2021-01-12

## 2020-01-21 NOTE — Telephone Encounter (Signed)
Patient calls nurse line requesting refill on losartan. Patient reports that she took last pill today and is requesting refill as soon as possible. Advised patient of rx refill policy.   To PCP  Veronda Prude, RN

## 2020-01-28 ENCOUNTER — Other Ambulatory Visit: Payer: Self-pay | Admitting: *Deleted

## 2020-01-28 DIAGNOSIS — R928 Other abnormal and inconclusive findings on diagnostic imaging of breast: Secondary | ICD-10-CM

## 2020-02-06 ENCOUNTER — Other Ambulatory Visit: Payer: Self-pay | Admitting: Family Medicine

## 2020-02-06 ENCOUNTER — Other Ambulatory Visit: Payer: Self-pay | Admitting: Emergency Medicine

## 2020-02-06 ENCOUNTER — Other Ambulatory Visit: Payer: Self-pay | Admitting: Internal Medicine

## 2020-02-06 DIAGNOSIS — R928 Other abnormal and inconclusive findings on diagnostic imaging of breast: Secondary | ICD-10-CM

## 2020-02-13 ENCOUNTER — Ambulatory Visit: Payer: Medicare HMO

## 2020-02-13 ENCOUNTER — Ambulatory Visit
Admission: RE | Admit: 2020-02-13 | Discharge: 2020-02-13 | Disposition: A | Discharge status: Home / Self Care | Payer: Medicare HMO | Source: Ambulatory Visit | Attending: *Deleted | Admitting: *Deleted

## 2020-02-13 ENCOUNTER — Other Ambulatory Visit: Payer: Self-pay

## 2020-02-13 DIAGNOSIS — R922 Inconclusive mammogram: Secondary | ICD-10-CM | POA: Diagnosis not present

## 2020-02-13 DIAGNOSIS — R928 Other abnormal and inconclusive findings on diagnostic imaging of breast: Secondary | ICD-10-CM

## 2020-03-25 ENCOUNTER — Telehealth: Payer: Self-pay

## 2020-03-25 NOTE — Telephone Encounter (Signed)
Fax received from [facility] requesting the following:  [employer] True Metrix Test Strip True Metrix Blood Glucose MTR Tru Metrix Level 1 CTRL Soln  Please order if appropriate  Sunday Spillers, CMA

## 2020-03-25 NOTE — Telephone Encounter (Signed)
[employer] alos requested  Trueplus Super Think 28G lancet.  Sunday Spillers, CMA

## 2020-03-26 ENCOUNTER — Other Ambulatory Visit: Payer: Self-pay | Admitting: Family Medicine

## 2020-03-26 MED ORDER — TRUEPLUS LANCETS 28G MISC: 1.0000 | Freq: Four times a day (QID) | 3 refills | Status: AC

## 2020-04-02 ENCOUNTER — Ambulatory Visit (INDEPENDENT_AMBULATORY_CARE_PROVIDER_SITE_OTHER): Payer: Medicare HMO | Admitting: Family Medicine

## 2020-04-02 ENCOUNTER — Encounter: Payer: Self-pay | Admitting: Family Medicine

## 2020-04-02 ENCOUNTER — Other Ambulatory Visit: Payer: Self-pay

## 2020-04-02 VITALS — BP 132/80 | HR 67 | Wt 136.6 lb

## 2020-04-02 DIAGNOSIS — I10 Essential (primary) hypertension: Secondary | ICD-10-CM | POA: Diagnosis not present

## 2020-04-02 DIAGNOSIS — E119 Type 2 diabetes mellitus without complications: Secondary | ICD-10-CM | POA: Diagnosis not present

## 2020-04-02 DIAGNOSIS — E785 Hyperlipidemia, unspecified: Secondary | ICD-10-CM

## 2020-04-02 LAB — POCT GLYCOSYLATED HEMOGLOBIN (HGB A1C): Hemoglobin A1C: 5.4 % (ref 4.0–5.6)

## 2020-04-02 MED ORDER — DICLOFENAC SODIUM 1 % EX GEL: 2.0000 g | Freq: Four times a day (QID) | CUTANEOUS | 0 refills | Status: AC

## 2020-04-02 NOTE — Assessment & Plan Note (Signed)
A1c 5.4 today.  Congratulated patient on her efforts and encouraged her to keep up the great work.  Her goal is 7-8.  Stopped Metformin.  Recheck again in 3 to 6 months.

## 2020-04-02 NOTE — Assessment & Plan Note (Addendum)
BP at goal, 132/80.  Continue Cozaar at current dose.  Follow-up in 2 to 3 months.  Obtain BMP today

## 2020-04-02 NOTE — Assessment & Plan Note (Signed)
Obtained lipid panel today.  Continue atorvastatin 10 mg.

## 2020-04-02 NOTE — Patient Instructions (Addendum)
Thank you for coming to see me today. It was a pleasure. Today we discussed your diabetes. It is excellent and you not longer need metformin. I recommend stopping metformin.   I have sent you in Voltaren gel for your sternal bone pain.  Please follow-up with me in 2 weeks.  If you have any questions or concerns, please do not hesitate to call the office at [phone].  We will get some labs today.  If they are abnormal or we need to do something about them, I will call you.  If they are normal, I will send you a message on MyChart (if it is active) or a letter in the mail.  If you don't hear from [place] in 2 weeks, please call the office at the number below.   Best wishes,   Dr Allena Katz

## 2020-04-02 NOTE — Progress Notes (Signed)
     SUBJECTIVE:   CHIEF COMPLAINT / HPI:   Stacy Nguyen is a 65 y.o. female presents for physical    Diabetes Patient's current diabetic medications include metformin. Tolerating well without side effects.  Patient endorses compliance with these medications. CBG readings averaging in the 130-140 range.  Patient's last A1c was  Lab Results  Component Value Date   HGBA1C 5.4 04/02/2020   HGBA1C 5.6 11/27/2018   HGBA1C 5.9 10/12/2018  Denies abdominal pain, blurred vision, polyuria, polydipsia, hypoglycemia. Patient states they understand that diet and exercise can help with her diabetes.  Last Microalbumin, LDL, Creatinine: Lab Results  Component Value Date   MICROALBUR 20 11/27/2018   LDLCALC 66 11/27/2018   CREATININE 0.98 11/27/2018    HLD Patient is currently taking Atorvastatin. Endorses compliance and tolerating well without side effects. Denies RUQ pain or myalgias.  Hypertension: Patient's current antihypertensive  medications include: Cozaar. Compliant with medications and tolerating well without side effects.  Checking BP at home with readings between 112 and 168. Denies any SOB, CP, vision changes, LE edema, medication SEs, or symptoms of hypotension.   Most recent creatinine trend:  Lab Results  Component Value Date   CREATININE 0.98 11/27/2018   CREATININE 0.86 09/14/2018     Patient has not had a BMP in the past 1 year.    PERTINENT  PMH / PSH:   OBJECTIVE:   BP 132/80   Pulse 67   Wt 136 lb 9.6 oz (62 kg)   LMP  (LMP Unknown)   SpO2 97%   BMI 25.81 kg/m    General: Alert, no acute distress Cardio: Normal S1 and S2, RRR, no r/m/g Pulm: CTAB, normal work of breathing Abdomen: Bowel sounds normal. Abdomen soft and non-tender.  Extremities: No peripheral edema.  Neuro: Cranial nerves grossly intact   ASSESSMENT/PLAN:   HTN (hypertension) BP at goal, 132/80.  Continue Cozaar at current dose.  Follow-up in 2 to 3 months.  Obtain BMP  today  Hyperlipidemia Obtained lipid panel today.  Continue atorvastatin 10 mg.  Diabetes mellitus without complication (HCC) A1c 5.4 today.  Congratulated patient on her efforts and encouraged her to keep up the great work.  Her goal is 7-8.  Stopped Metformin.  Recheck again in 3 to 6 months.     Towanda Octave, MD PGY-2 [facility]

## 2020-04-03 LAB — LIPID PANEL
Chol/HDL Ratio: 2.5 ratio (ref 0.0–4.4)
Cholesterol, Total: 138 mg/dL (ref 100–199)
HDL: 55 mg/dL (ref >39)
LDL Chol Calc (NIH): 74 mg/dL (ref 0–99)
Triglycerides: 33 mg/dL (ref 0–149)
VLDL Cholesterol Cal: 9 mg/dL (ref 5–40)

## 2020-04-03 LAB — COMPREHENSIVE METABOLIC PANEL
ALT: 13 IU/L (ref 0–32)
AST: 15 IU/L (ref 0–40)
Albumin/Globulin Ratio: 1.7 (ref 1.2–2.2)
Albumin: 4.3 g/dL (ref 3.8–4.8)
Alkaline Phosphatase: 75 IU/L (ref 44–121)
BUN/Creatinine Ratio: 15 (ref 12–28)
BUN: 12 mg/dL (ref 8–27)
Bilirubin Total: <0.2 mg/dL (ref 0.0–1.2)
CO2: 25 mmol/L (ref 20–29)
Calcium: 9.5 mg/dL (ref 8.7–10.3)
Chloride: 106 mmol/L (ref 96–106)
Creatinine, Ser: 0.82 mg/dL (ref 0.57–1.00)
GFR calc Af Amer: 87 mL/min/{1.73_m2} (ref >59)
GFR calc non Af Amer: 75 mL/min/{1.73_m2} (ref >59)
Globulin, Total: 2.5 g/dL (ref 1.5–4.5)
Glucose: 94 mg/dL (ref 65–99)
Potassium: 4.2 mmol/L (ref 3.5–5.2)
Sodium: 145 mmol/L — ABNORMAL HIGH (ref 134–144)
Total Protein: 6.8 g/dL (ref 6.0–8.5)

## 2020-05-06 ENCOUNTER — Other Ambulatory Visit: Payer: Self-pay

## 2020-05-06 ENCOUNTER — Encounter: Payer: Self-pay | Admitting: Family Medicine

## 2020-05-06 ENCOUNTER — Ambulatory Visit (INDEPENDENT_AMBULATORY_CARE_PROVIDER_SITE_OTHER): Payer: Medicare HMO | Admitting: Family Medicine

## 2020-05-06 VITALS — BP 140/88 | HR 54 | Ht 61.0 in | Wt 136.0 lb

## 2020-05-06 DIAGNOSIS — K5900 Constipation, unspecified: Secondary | ICD-10-CM | POA: Diagnosis not present

## 2020-05-06 DIAGNOSIS — G8929 Other chronic pain: Secondary | ICD-10-CM | POA: Diagnosis not present

## 2020-05-06 DIAGNOSIS — M25562 Pain in left knee: Secondary | ICD-10-CM

## 2020-05-06 DIAGNOSIS — L989 Disorder of the skin and subcutaneous tissue, unspecified: Secondary | ICD-10-CM | POA: Diagnosis not present

## 2020-05-06 DIAGNOSIS — M25561 Pain in right knee: Secondary | ICD-10-CM | POA: Diagnosis not present

## 2020-05-06 DIAGNOSIS — M170 Bilateral primary osteoarthritis of knee: Secondary | ICD-10-CM

## 2020-05-06 DIAGNOSIS — K649 Unspecified hemorrhoids: Secondary | ICD-10-CM | POA: Diagnosis not present

## 2020-05-06 MED ORDER — HYDROCORTISONE (PERIANAL) 2.5 % EX CREA: 1.0000 "application " | TOPICAL_CREAM | Freq: Two times a day (BID) | CUTANEOUS | 0 refills | Status: DC

## 2020-05-06 MED ORDER — POLYETHYLENE GLYCOL 3350 17 GM/SCOOP PO POWD: 17.0000 g | Freq: Two times a day (BID) | ORAL | 1 refills | Status: DC | PRN

## 2020-05-06 NOTE — Patient Instructions (Addendum)
Thank you for coming to see me today. It was a pleasure. Today we discussed your knee pain. It is probably arthritis. I recommend some knee imaging. Keep taking tylenol for the pain and using the gel.   The lump-I do not think it is dangerous. Please book an appointment with the dermatology clinic.  I have placed an order for knee xray.  Please go to [facility] at [employer] [facility] or at [facility] to have this completed.  You do not need an appointment, but if you would like to call them beforehand, their number is [phone].  We will contact you with your results afterwards.  Please follow-up with me in 2-4 weeks   If you have any questions or concerns, please do not hesitate to call the office at [phone].  Best wishes,   Dr Allena Katz

## 2020-05-06 NOTE — Progress Notes (Addendum)
     SUBJECTIVE:   CHIEF COMPLAINT / HPI:   Stacy Nguyen is a 65 y.o. female presents for follow up   Lump on left forearm 0.5cm lump under skin, present for 1 year, following a bug bite. Has not increased in size. When she pinches it it hurts. Prior to the bug bite her skin was normal in that area. It bothers her when she messes with it.   Knee pain She stands on her feet all day. Her right knee locks regularly. Difficulty going up the stairs. Compensates by using left knee. Voltaren gel is helping. Did fall on right knee a few months ago.   Flowsheet Row Office Visit from 05/06/2020 in [facility]  PHQ-9 Total Score 1       Health Maintenance Due  Topic  . TETANUS/TDAP   . COLONOSCOPY (Pts 45-83yrs Insurance coverage will need to be confirmed)   . PNA vac Low Risk Adult (1 of 2 - PCV13)  . FOOT EXAM   . OPHTHALMOLOGY EXAM       PERTINENT  PMH / PSH:   OBJECTIVE:   BP 140/88   Pulse (!) 54   Ht 5\' 1"  (1.549 m)   Wt 136 lb (61.7 kg)   LMP  (LMP Unknown)   SpO2 98%   BMI 25.70 kg/m    General: Alert, no acute distress Cardio: Normal S1 and S2, RRR, no r/m/g Pulm: CTAB, normal work of breathing Extremities: No peripheral edema.  Knee: - Inspection: no gross deformity. No swelling/effusion, erythema or bruising. Skin intact - Palpation: mild TTP - ROM: full active ROM with flexion and extension in knee and hip, crepitus felt on active and passive flexion extension. - Strength: 5/5 strength - Neuro/vasc: NV intact - Special Tests: - LIGAMENTS: negative anterior and posterior drawer, negative Lachman's, no MCL or LCL laxity   Neuro: Cranial nerves grossly intact        ASSESSMENT/PLAN:   Knee pain Pt endorses chronic bilateral knee pain. Worse in left knee. Examination: no erythema, edema, no overlying skin changes. Tenderness on palpation of left knee joint. Crepitus on active and passive movement. Ordered bilateral knee  x-rays. Recommended continuing tylenol and Voltaren gel for knee pain.  Constipation Likely due to starting recent vitamins which contain iron. Prescribed miralax. Pt will also need age related cancer screening which we will discuss at next visit.   Hemorrhoid One external hemorrhoid on exam today likely due to constipation and recent straining. Prescribed anusol cream.   Skin lesion of right arm 0.5cm, round hyperpigmented nodule over right forearm. Pt reports this has been there since she was stung by a bee last year. No tenderness on palpation. It was not there prior to the bee sting. Likely scar tissue post bee sting. Also considered nevus, skin malignancy etc however lower on differential based on history.  It is bothering pt and she would like it removed. Referred pt to derm clinic for removal +/- biopsy if indicated.      , MD PGY-2 [facility]

## 2020-05-08 ENCOUNTER — Ambulatory Visit
Admission: RE | Admit: 2020-05-08 | Discharge: 2020-05-08 | Disposition: A | Discharge status: Home / Self Care | Payer: Medicare HMO | Source: Ambulatory Visit | Attending: Family Medicine | Admitting: Family Medicine

## 2020-05-08 ENCOUNTER — Other Ambulatory Visit: Payer: Self-pay

## 2020-05-08 DIAGNOSIS — M25562 Pain in left knee: Secondary | ICD-10-CM | POA: Diagnosis not present

## 2020-05-08 DIAGNOSIS — M170 Bilateral primary osteoarthritis of knee: Secondary | ICD-10-CM

## 2020-05-08 DIAGNOSIS — M25561 Pain in right knee: Secondary | ICD-10-CM | POA: Diagnosis not present

## 2020-05-09 ENCOUNTER — Telehealth: Payer: Self-pay | Admitting: Family Medicine

## 2020-05-09 NOTE — Telephone Encounter (Signed)
Left brief VM explaining xray results. Will follow up at next appointment later this month.

## 2020-05-10 ENCOUNTER — Other Ambulatory Visit: Payer: Self-pay | Admitting: Family Medicine

## 2020-05-13 DIAGNOSIS — M25569 Pain in unspecified knee: Secondary | ICD-10-CM | POA: Insufficient documentation

## 2020-05-13 DIAGNOSIS — L989 Disorder of the skin and subcutaneous tissue, unspecified: Secondary | ICD-10-CM | POA: Insufficient documentation

## 2020-05-13 DIAGNOSIS — K649 Unspecified hemorrhoids: Secondary | ICD-10-CM | POA: Insufficient documentation

## 2020-05-13 DIAGNOSIS — K5900 Constipation, unspecified: Secondary | ICD-10-CM | POA: Insufficient documentation

## 2020-05-13 NOTE — Assessment & Plan Note (Signed)
0.5cm, round hyperpigmented nodule over right forearm. Pt reports this has been there since she was stung by a bee last year. No tenderness on palpation. It was not there prior to the bee sting. Likely scar tissue post bee sting. Also considered nevus, skin malignancy etc however lower on differential based on history.  It is bothering pt and she would like it removed. Referred pt to derm clinic for removal +/- biopsy if indicated.

## 2020-05-13 NOTE — Assessment & Plan Note (Signed)
Pt endorses chronic bilateral knee pain. Worse in left knee. Examination: no erythema, edema, no overlying skin changes. Tenderness on palpation of left knee joint. Crepitus on active and passive movement. Ordered bilateral knee x-rays. Recommended continuing tylenol and Voltaren gel for knee pain.

## 2020-05-13 NOTE — Assessment & Plan Note (Addendum)
Likely due to starting recent vitamins which contain iron. Prescribed miralax. Pt will also need age related cancer screening which we will discuss at next visit.

## 2020-05-13 NOTE — Assessment & Plan Note (Addendum)
One external hemorrhoid on exam today likely due to constipation and recent straining. Prescribed anusol cream.

## 2020-05-29 ENCOUNTER — Ambulatory Visit (INDEPENDENT_AMBULATORY_CARE_PROVIDER_SITE_OTHER): Payer: Medicare HMO | Admitting: Family Medicine

## 2020-05-29 ENCOUNTER — Other Ambulatory Visit: Payer: Self-pay

## 2020-05-29 VITALS — BP 118/75 | HR 74 | Ht 60.0 in | Wt 135.4 lb

## 2020-05-29 DIAGNOSIS — R229 Localized swelling, mass and lump, unspecified: Secondary | ICD-10-CM | POA: Diagnosis not present

## 2020-05-29 NOTE — Assessment & Plan Note (Signed)
Chronic, asymptomatic postinflammatory nodule, improving. Appears benign. Discussed management options including observation vs punch biopsy to remove. Patient opted for observation. Return precautions discussed.

## 2020-05-29 NOTE — Progress Notes (Signed)
   Subjective:   Patient ID: Stacy Nguyen    DOB: March 11, 1955, 65 y.o. female   MRN: [id number]  Stacy Nguyen is a 65 y.o. female  here for Skin lesion  HPI: Patient presents for further evaluation of spot on right forearm x ~5 months ago after insect bite to the area. Now the area has a nodule underlying that she just wants to make sure it isnt anything bad. Denies any pain, itching, erythema, discharge. Denies any history of skin cancer or other cancers.  Review of Systems:  Per HPI.   Objective:   BP 118/75   Pulse 74   Ht 5' (1.524 m)   Wt 135 lb 6.4 oz (61.4 kg)   LMP  (LMP Unknown)   SpO2 96%   BMI 26.44 kg/m  Vitals and nursing note reviewed.  General: pleasant older woman, sitting comfortably in exam chair, well nourished, well developed, in no acute distress with non-toxic appearance Resp: breathing comfortably on room air, speaking in full sentences Skin: warm, dry, 0.4cm subcutaneous round mobile round nodule on right anterior medial forearm with overlying hyperpigmentation, no surrounding erythema, warmth MSK:  gait normal Neuro: Alert and oriented, speech normal      Assessment & Plan:   Subcutaneous nodule Chronic, asymptomatic postinflammatory nodule, improving. Appears benign. Discussed management options including observation vs punch biopsy to remove. Patient opted for observation. Return precautions discussed.  No orders of the defined types were placed in this encounter.  No orders of the defined types were placed in this encounter.     Orpah Cobb, DO PGY-3, [facility] 05/29/2020 3:03 PM

## 2020-05-29 NOTE — Patient Instructions (Signed)
I do not think this is anything worrisome. I recommend just continuing to monitor and have PCP look at it at each visit. Follow up if it becomes painful or any signs of infection.

## 2020-06-03 ENCOUNTER — Ambulatory Visit: Payer: Medicare HMO | Admitting: Family Medicine

## 2020-06-03 NOTE — Progress Notes (Deleted)
     SUBJECTIVE:   CHIEF COMPLAINT / HPI:   Stacy Nguyen is a 65 y.o. female presents for follow up   Bilateral knee pain Prescribed tylenol and voltaren gel at previous visit. Bilateral knee x-rays negative.    Flowsheet Row Office Visit from 05/29/2020 in [facility]  PHQ-9 Total Score 0       Health Maintenance Due  Topic  . TETANUS/TDAP   . COLONOSCOPY (Pts 45-58yrs Insurance coverage will need to be confirmed)   . PNA vac Low Risk Adult (1 of 2 - PCV13)  . FOOT EXAM   . OPHTHALMOLOGY EXAM       PERTINENT  PMH / PSH:   OBJECTIVE:   LMP  (LMP Unknown)    General: Alert, no acute distress Cardio: Normal S1 and S2, RRR, no r/m/g Pulm: CTAB, normal work of breathing Abdomen: Bowel sounds normal. Abdomen soft and non-tender.  Extremities: No peripheral edema.  Neuro: Cranial nerves grossly intact   ASSESSMENT/PLAN:   No problem-specific Assessment & Plan notes found for this encounter.     Towanda Octave, MD PGY-2 [facility]

## 2020-06-13 ENCOUNTER — Other Ambulatory Visit: Payer: Self-pay

## 2020-06-13 ENCOUNTER — Ambulatory Visit (INDEPENDENT_AMBULATORY_CARE_PROVIDER_SITE_OTHER): Payer: Medicare HMO | Admitting: Family Medicine

## 2020-06-13 ENCOUNTER — Encounter: Payer: Self-pay | Admitting: Family Medicine

## 2020-06-13 VITALS — BP 138/80 | HR 62 | Ht 60.0 in | Wt 137.8 lb

## 2020-06-13 DIAGNOSIS — M25562 Pain in left knee: Secondary | ICD-10-CM

## 2020-06-13 DIAGNOSIS — H547 Unspecified visual loss: Secondary | ICD-10-CM

## 2020-06-13 DIAGNOSIS — H539 Unspecified visual disturbance: Secondary | ICD-10-CM

## 2020-06-13 DIAGNOSIS — M25561 Pain in right knee: Secondary | ICD-10-CM

## 2020-06-13 DIAGNOSIS — G8929 Other chronic pain: Secondary | ICD-10-CM

## 2020-06-13 DIAGNOSIS — R229 Localized swelling, mass and lump, unspecified: Secondary | ICD-10-CM

## 2020-06-13 NOTE — Patient Instructions (Signed)
Thank you for coming to see me today. It was a pleasure. Today we discussed your knee pain, I think this is related to prolonged standing. I recommend continuing tylenol, Voltaren gel. Do some knee stretches to help with the pain.   Please follow-up with me in 4 weeks   If you have any questions or concerns, please do not hesitate to call the office at [phone].  Best wishes,   Dr Allena Katz    Journal for Nurse Practitioners, 15(4), [phone]. Retrieved November 14, 2017 from http://clinicalkey.com/nursing">  Knee Exercises Ask your health care provider which exercises are safe for you. Do exercises exactly as told by your health care provider and adjust them as directed. It is normal to feel mild stretching, pulling, tightness, or discomfort as you do these exercises. Stop right away if you feel sudden pain or your pain gets worse. Do not begin these exercises until told by your health care provider. Stretching and range-of-motion exercises These exercises warm up your muscles and joints and improve the movement and flexibility of your knee. These exercises also help to relieve pain and swelling. Knee extension, prone 1. Lie on your abdomen (prone position) on a bed. 2. Place your left / right knee just beyond the edge of the surface so your knee is not on the bed. You can put a towel under your left / right thigh just above your kneecap for comfort. 3. Relax your leg muscles and allow gravity to straighten your knee (extension). You should feel a stretch behind your left / right knee. 4. Hold this position for __________ seconds. 5. Scoot up so your knee is supported between repetitions. Repeat __________ times. Complete this exercise __________ times a day. Knee flexion, active 1. Lie on your back with both legs straight. If this causes back discomfort, bend your left / right knee so your foot is flat on the floor. 2. Slowly slide your left / right heel back toward your buttocks. Stop when  you feel a gentle stretch in the front of your knee or thigh (flexion). 3. Hold this position for __________ seconds. 4. Slowly slide your left / right heel back to the starting position. Repeat __________ times. Complete this exercise __________ times a day.   Quadriceps stretch, prone 1. Lie on your abdomen on a firm surface, such as a bed or padded floor. 2. Bend your left / right knee and hold your ankle. If you cannot reach your ankle or pant leg, loop a belt around your foot and grab the belt instead. 3. Gently pull your heel toward your buttocks. Your knee should not slide out to the side. You should feel a stretch in the front of your thigh and knee (quadriceps). 4. Hold this position for __________ seconds. Repeat __________ times. Complete this exercise __________ times a day.   Hamstring, supine 1. Lie on your back (supine position). 2. Loop a belt or towel over the ball of your left / right foot. The ball of your foot is on the walking surface, right under your toes. 3. Straighten your left / right knee and slowly pull on the belt to raise your leg until you feel a gentle stretch behind your knee (hamstring). ? Do not let your knee bend while you do this. ? Keep your other leg flat on the floor. 4. Hold this position for __________ seconds. Repeat __________ times. Complete this exercise __________ times a day. Strengthening exercises These exercises build strength and endurance in your knee.  Endurance is the ability to use your muscles for a long time, even after they get tired. Quadriceps, isometric This exercise stretches the muscles in front of your thigh (quadriceps) without moving your knee joint (isometric). 1. Lie on your back with your left / right leg extended and your other knee bent. Put a rolled towel or small pillow under your knee if told by your health care provider. 2. Slowly tense the muscles in the front of your left / right thigh. You should see your kneecap  slide up toward your hip or see increased dimpling just above the knee. This motion will push the back of the knee toward the floor. 3. For __________ seconds, hold the muscle as tight as you can without increasing your pain. 4. Relax the muscles slowly and completely. Repeat __________ times. Complete this exercise __________ times a day.   Straight leg raises This exercise stretches the muscles in front of your thigh (quadriceps) and the muscles that move your hips (hip flexors). 1. Lie on your back with your left / right leg extended and your other knee bent. 2. Tense the muscles in the front of your left / right thigh. You should see your kneecap slide up or see increased dimpling just above the knee. Your thigh may even shake a bit. 3. Keep these muscles tight as you raise your leg 4-6 inches (10-15 cm) off the floor. Do not let your knee bend. 4. Hold this position for __________ seconds. 5. Keep these muscles tense as you lower your leg. 6. Relax your muscles slowly and completely after each repetition. Repeat __________ times. Complete this exercise __________ times a day. Hamstring, isometric 1. Lie on your back on a firm surface. 2. Bend your left / right knee about __________ degrees. 3. Dig your left / right heel into the surface as if you are trying to pull it toward your buttocks. Tighten the muscles in the back of your thighs (hamstring) to "dig" as hard as you can without increasing any pain. 4. Hold this position for __________ seconds. 5. Release the tension gradually and allow your muscles to relax completely for __________ seconds after each repetition. Repeat __________ times. Complete this exercise __________ times a day. Hamstring curls If told by your health care provider, do this exercise while wearing ankle weights. Begin with __________ lb weights. Then increase the weight by 1 lb (0.5 kg) increments. Do not wear ankle weights that are more than __________ lb. 1. Lie  on your abdomen with your legs straight. 2. Bend your left / right knee as far as you can without feeling pain. Keep your hips flat against the floor. 3. Hold this position for __________ seconds. 4. Slowly lower your leg to the starting position. Repeat __________ times. Complete this exercise __________ times a day.   Squats This exercise strengthens the muscles in front of your thigh and knee (quadriceps). 1. Stand in front of a table, with your feet and knees pointing straight ahead. You may rest your hands on the table for balance but not for support. 2. Slowly bend your knees and lower your hips like you are going to sit in a chair. ? Keep your weight over your heels, not over your toes. ? Keep your lower legs upright so they are parallel with the table legs. ? Do not let your hips go lower than your knees. ? Do not bend lower than told by your health care provider. ? If your knee pain increases,  do not bend as low. 3. Hold the squat position for __________ seconds. 4. Slowly push with your legs to return to standing. Do not use your hands to pull yourself to standing. Repeat __________ times. Complete this exercise __________ times a day. Wall slides This exercise strengthens the muscles in front of your thigh and knee (quadriceps). 1. Lean your back against a smooth wall or door, and walk your feet out 18-24 inches (46-61 cm) from it. 2. Place your feet hip-width apart. 3. Slowly slide down the wall or door until your knees bend __________ degrees. Keep your knees over your heels, not over your toes. Keep your knees in line with your hips. 4. Hold this position for __________ seconds. Repeat __________ times. Complete this exercise __________ times a day.   Straight leg raises This exercise strengthens the muscles that rotate the leg at the hip and move it away from your body (hip abductors). 1. Lie on your side with your left / right leg in the top position. Lie so your head,  shoulder, knee, and hip line up. You may bend your bottom knee to help you keep your balance. 2. Roll your hips slightly forward so your hips are stacked directly over each other and your left / right knee is facing forward. 3. Leading with your heel, lift your top leg 4-6 inches (10-15 cm). You should feel the muscles in your outer hip lifting. ? Do not let your foot drift forward. ? Do not let your knee roll toward the ceiling. 4. Hold this position for __________ seconds. 5. Slowly return your leg to the starting position. 6. Let your muscles relax completely after each repetition. Repeat __________ times. Complete this exercise __________ times a day.   Straight leg raises This exercise stretches the muscles that move your hips away from the front of the pelvis (hip extensors). 1. Lie on your abdomen on a firm surface. You can put a pillow under your hips if that is more comfortable. 2. Tense the muscles in your buttocks and lift your left / right leg about 4-6 inches (10-15 cm). Keep your knee straight as you lift your leg. 3. Hold this position for __________ seconds. 4. Slowly lower your leg to the starting position. 5. Let your leg relax completely after each repetition. Repeat __________ times. Complete this exercise __________ times a day. This information is not intended to replace advice given to you by your health care provider. Make sure you discuss any questions you have with your health care provider. Document Revised: 11/15/2017 Document Reviewed: 11/15/2017 Elsevier Patient Education  2021 [employer].

## 2020-06-13 NOTE — Progress Notes (Addendum)
     SUBJECTIVE:   CHIEF COMPLAINT / HPI:   Stacy Nguyen is a 65 y.o. female presents for follow up   Knee pain Recent xrays negative. Work involves standing up all day on concrete/mat. She works at a [employer] things. She stands 8 hours a day. On days off she rests and the knee pain is better. Voltaren gel and tylenol helps a lot. 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   Right arm nodule  Recently seen at derm clinic for subcutaneous nodule likely post inflammatory from bug bite. Pt opted for conservative management   Flowsheet Row Office Visit from 06/13/2020 in [facility]  PHQ-9 Total Score 0       Health Maintenance Due  Topic  . TETANUS/TDAP   . COLONOSCOPY (Pts 45-32yrs Insurance coverage will need to be confirmed)   . PNA vac Low Risk Adult (1 of 2 - PCV13)  . FOOT EXAM   . OPHTHALMOLOGY EXAM   . COVID-19 Vaccine (3 - Booster for Moderna series)     PERTINENT  PMH / PSH: DM, HTN   OBJECTIVE:   BP 138/80   Pulse 62   Ht 5' (1.524 m)   Wt 137 lb 12.8 oz (62.5 kg)   LMP  (LMP Unknown)   SpO2 98%   BMI 26.91 kg/m    General: Alert, no acute distress Cardio: well perfused  Pulm: normal work of breathing Extremities: No peripheral edema.  Knee: - Inspection: no gross deformity. No swelling/effusion, erythema or bruising. Skin intact - Palpation: no TTP - ROM: full active ROM with flexion and extension in knee  - Strength: 5/5 strength - Neuro/vasc: NV intact - Special Tests: - LIGAMENTS: negative anterior and posterior drawer, negative Lachman's, no MCL or LCL laxity  -- MENISCUS: negative McMurray's, n Neuro: Cranial nerve exam normal   ASSESSMENT/PLAN:   Subcutaneous nodule Re-iterated plan following derm clinic. Asymptomatic postinflammatory nodule from bug bite on right forearm. Pt opted for conservative management.  Knee pain Bilateral knee xray negative for OA. Knee pain is likely occupational. Pt stands  for long hours all day at work. Recommended to continue voltaren gel, tylenol gel and also knee stretches. 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  Vision changes Pt reporting worsening in visual acuity and blurred vision. She has a hx of cataract and has not seen in ophthalmologist in a few years. CN exam normal. Visual acuity 20/70 bilaterally. Recommended she does not drive until she sees ophthalmologist. Referred to ophthalmology.     Towanda Octave, MD PGY-2 [facility]

## 2020-06-14 DIAGNOSIS — H539 Unspecified visual disturbance: Secondary | ICD-10-CM | POA: Insufficient documentation

## 2020-06-14 NOTE — Assessment & Plan Note (Signed)
Bilateral knee xray negative for OA. Knee pain is likely occupational. Pt stands for long hours all day at work. Recommended to continue voltaren gel, tylenol gel and also knee stretches. 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

## 2020-06-14 NOTE — Assessment & Plan Note (Signed)
Pt reporting worsening in visual acuity and blurred vision. She has a hx of cataract and has not seen in ophthalmologist in a few years. CN exam normal. Visual acuity 20/70 bilaterally. Recommended she does not drive until she sees ophthalmologist. Referred to ophthalmology.

## 2020-06-14 NOTE — Assessment & Plan Note (Signed)
Re-iterated plan following derm clinic. Asymptomatic postinflammatory nodule from bug bite on right forearm. Pt opted for conservative management.

## 2020-09-10 ENCOUNTER — Other Ambulatory Visit: Payer: Self-pay | Admitting: Family Medicine

## 2020-10-12 ENCOUNTER — Other Ambulatory Visit: Payer: Self-pay | Admitting: Family Medicine

## 2021-01-10 ENCOUNTER — Other Ambulatory Visit: Payer: Self-pay | Admitting: Family Medicine

## 2021-01-29 ENCOUNTER — Other Ambulatory Visit: Payer: Self-pay

## 2021-01-29 ENCOUNTER — Ambulatory Visit (HOSPITAL_COMMUNITY)
Admission: EM | Admit: 2021-01-29 | Discharge: 2021-01-29 | Disposition: A | Discharge status: Home / Self Care | Payer: Medicare HMO | Attending: Student | Admitting: Student

## 2021-01-29 ENCOUNTER — Encounter (HOSPITAL_COMMUNITY): Payer: Self-pay

## 2021-01-29 DIAGNOSIS — Z7689 Persons encountering health services in other specified circumstances: Secondary | ICD-10-CM

## 2021-01-29 DIAGNOSIS — I10 Essential (primary) hypertension: Secondary | ICD-10-CM

## 2021-01-29 DIAGNOSIS — T5991XA Toxic effect of unspecified gases, fumes and vapors, accidental (unintentional), initial encounter: Secondary | ICD-10-CM

## 2021-01-29 DIAGNOSIS — E1159 Type 2 diabetes mellitus with other circulatory complications: Secondary | ICD-10-CM

## 2021-01-29 DIAGNOSIS — E1169 Type 2 diabetes mellitus with other specified complication: Secondary | ICD-10-CM

## 2021-01-29 LAB — CBG MONITORING, ED: Glucose-Capillary: 88 mg/dL (ref 70–99)

## 2021-01-29 NOTE — ED Triage Notes (Signed)
Pt presents with c/o dizziness x 1 hour. States her work place had a severe gas leak and states she started to feel dizzy after. States she had a headache and reports needing a note stating it is okay for her to go back to work. Pt states she took Tylenol when the headache started.

## 2021-01-29 NOTE — Discharge Instructions (Addendum)
-  Your exam is reassuring today. If you develop new symptoms like dizziness, headaches, chest pain, weakness, vision changes, etc- head to the ED or call 911.  -Please check your blood pressure at home or at the pharmacy. If this continues to be >140/90, follow-up with your primary care provider for further blood pressure management/ medication titration. If you develop chest pain, shortness of breath, vision changes, the worst headache of your life- head straight to the ED or call 911.

## 2021-01-29 NOTE — ED Provider Notes (Signed)
[facility]    CSN: [id number] Arrival date & time: 01/29/21  1621      History   Chief Complaint Chief Complaint  Patient presents with   Dizziness    HPI Stacy Nguyen is a 65 y.o. female presenting with dizziness following gas leak exposure. Medical history diabetes. Unsure what type of gas but states the entire area even outside smelled of gas. During the leak she felt dizzy and lightheaded, so she was taken outside but could still smell the gas there. Symptoms lasted about 1 hour and she started feeling a lot better. Initially with headache that has resolved due to tylenol. Dizziness has also resolved on its own. She is feeling well now and was able to drive herself here. Needs a note to return to work.  HPI  Past Medical History:  Diagnosis Date   Anxiety    Cataract    bilateral - MD just watching   Depression    Hyperlipidemia    Hypertension    Type 2 diabetes mellitus [facility])     Patient Active Problem List   Diagnosis Date Noted   Vision changes 06/14/2020   Subcutaneous nodule 05/29/2020   Knee pain 05/13/2020   Constipation 05/13/2020   Hemorrhoid 05/13/2020   Skin lesion of right arm 05/13/2020   Suspected COVID-19 virus infection 02/13/2019   Healthcare maintenance 11/27/2018   Hyperlipidemia 10/12/2018   Diabetes mellitus without complication (HCC) 08/25/2018   HTN (hypertension) 08/25/2018    Past Surgical History:  Procedure Laterality Date   COLONOSCOPY     greater than 10 yrs ago out of state   SHOULDER SURGERY Left 1986   UPPER GASTROINTESTINAL ENDOSCOPY      OB History   No obstetric history on file.      Home Medications    Prior to Admission medications   Medication Sig Start Date End Date Taking? Authorizing Provider  acetaminophen (TYLENOL) 500 MG tablet Take 500 mg by mouth every 6 (six) hours as needed for mild pain, moderate pain or headache.    [provider]  atorvastatin (LIPITOR) 10 MG  tablet TAKE 1 TABLET(10 MG) BY MOUTH DAILY 10/14/20   Towanda Octave, MD  citalopram (CELEXA) 20 MG tablet TAKE 1 TABLET(20 MG) BY MOUTH DAILY 10/14/20   Towanda Octave, MD  hydrocortisone (ANUSOL-HC) 2.5 % rectal cream Place 1 application rectally 2 (two) times daily. 05/06/20   Towanda Octave, MD  losartan (COZAAR) 50 MG tablet TAKE 1 TABLET(50 MG) BY MOUTH DAILY 01/12/21   Autry-Lott, Randa Evens, DO  metFORMIN (GLUCOPHAGE) 500 MG tablet Take 1 tablet (500 mg total) by mouth daily with breakfast. 12/03/19   Towanda Octave, MD  polyethylene glycol powder (GLYCOLAX/MIRALAX) 17 GM/SCOOP powder Take 17 g by mouth 2 (two) times daily as needed. 05/06/20   Towanda Octave, MD    Family History Family History  Problem Relation Age of Onset   Hypertension Mother    Alzheimer's disease Father    Diabetes Father    Colon cancer Neg Hx    Rectal cancer Neg Hx    Stomach cancer Neg Hx     Social History Social History   Tobacco Use   Smoking status: Never   Smokeless tobacco: Never  Vaping Use   Vaping Use: Never used  Substance Use Topics   Alcohol use: Never   Drug use: Never     Allergies   Lisinopril   Review of Systems Review of Systems  Neurological:  Positive  for dizziness and headaches. Negative for numbness.    Physical Exam Triage Vital Signs ED Triage Vitals  Enc Vitals Group     BP      Pulse      Resp      Temp      Temp src      SpO2      Weight      Height      Head Circumference      Peak Flow      Pain Score      Pain Loc      Pain Edu?      Excl. in GC?    No data found.  Updated Vital Signs BP (!) 169/93 (BP Location: Left Arm)    Pulse (!) 58    Temp 98.3 F (36.8 C) (Oral)    Resp 15    LMP  (LMP Unknown)    SpO2 98%   Visual Acuity Right Eye Distance:   Left Eye Distance:   Bilateral Distance:    Right Eye Near:   Left Eye Near:    Bilateral Near:     Physical Exam Vitals reviewed.  Constitutional:      General: She is not in acute distress.     Appearance: Normal appearance. She is not ill-appearing.  HENT:     Head: Normocephalic and atraumatic.  Eyes:     Extraocular Movements: Extraocular movements intact.     Pupils: Pupils are equal, round, and reactive to light.  Cardiovascular:     Rate and Rhythm: Normal rate and regular rhythm.     Heart sounds: Normal heart sounds.  Pulmonary:     Effort: Pulmonary effort is normal.     Breath sounds: Normal breath sounds. No wheezing, rhonchi or rales.  Musculoskeletal:     Cervical back: Normal range of motion and neck supple. No rigidity.  Lymphadenopathy:     Cervical: No cervical adenopathy.  Skin:    Capillary Refill: Capillary refill takes less than 2 seconds.  Neurological:     General: No focal deficit present.     Mental Status: She is alert and oriented to person, place, and time. Mental status is at baseline.     Cranial Nerves: No cranial nerve deficit or facial asymmetry.     Sensory: Sensation is intact. No sensory deficit.     Motor: Motor function is intact. No weakness.     Coordination: Coordination is intact. Coordination normal.     Gait: Gait is intact. Gait normal.     Comments: PERRLA, EOMI. CN 2-12 intact. No weakness or numbness in UEs or LEs. Negative finger to thumb, rhomberg, pronator drift.   Psychiatric:        Mood and Affect: Mood normal.        Behavior: Behavior normal.        Thought Content: Thought content normal.        Judgment: Judgment normal.     UC Treatments / Results  Labs (all labs ordered are listed, but only abnormal results are displayed) Labs Reviewed  CBG MONITORING, ED    EKG   Radiology No results found.  Procedures Procedures (including critical care time)  Medications Ordered in UC Medications - No data to display  Initial Impression / Assessment and Plan / UC Course  I have reviewed the triage vital signs and the nursing notes.  Pertinent labs & imaging results that were available during my care of  the patient were reviewed by me and considered in my medical decision making (see chart for details).     This patient is a very pleasant 65 y.o. year old female presenting to receive clearance to return to work following exposure to gas (unsure what type). Neuro exam is very reassuring.   BP is initially 169/93, on repeat 150/95. Takes her antihypertensives at night. Denies headaches, dizziness, CP, SOB, vision changes. She is a diabetic. POC CBG 88  Reassurance provided. Strict ED return precautions discussed. Patient verbalizes understanding and agreement.   Discussed treatment plan with attending physician Dr. Tracie Harrier who is in agreement.   Final Clinical Impressions(s) / UC Diagnoses   Final diagnoses:  Toxic effect of gas exposure, accidental or unintentional, initial encounter  Return to work evaluation  Essential hypertension  Type 2 diabetes mellitus with other specified complication, without long-term current use of insulin [facility])     Discharge Instructions      -Your exam is reassuring today. If you develop new symptoms like dizziness, headaches, chest pain, weakness, vision changes, etc- head to the ED or call 911.  -Please check your blood pressure at home or at the pharmacy. If this continues to be >140/90, follow-up with your primary care provider for further blood pressure management/ medication titration. If you develop chest pain, shortness of breath, vision changes, the worst headache of your life- head straight to the ED or call 911.      ED Prescriptions   None    PDMP not reviewed this encounter.   Rhys Martini, PA-C 01/29/21 1723

## 2021-02-10 ENCOUNTER — Other Ambulatory Visit: Payer: Self-pay | Admitting: Family Medicine

## 2021-02-10 DIAGNOSIS — Z1231 Encounter for screening mammogram for malignant neoplasm of breast: Secondary | ICD-10-CM

## 2021-02-12 ENCOUNTER — Other Ambulatory Visit: Payer: Self-pay

## 2021-02-12 ENCOUNTER — Ambulatory Visit
Admission: RE | Admit: 2021-02-12 | Discharge: 2021-02-12 | Disposition: A | Discharge status: Home / Self Care | Payer: No Typology Code available for payment source | Source: Ambulatory Visit | Attending: Family Medicine | Admitting: Family Medicine

## 2021-02-12 DIAGNOSIS — Z1231 Encounter for screening mammogram for malignant neoplasm of breast: Secondary | ICD-10-CM

## 2021-02-18 NOTE — Progress Notes (Signed)
° ° ° °  SUBJECTIVE:   CHIEF COMPLAINT / HPI:   Stacy Nguyen is a 66 y.o. female presents for diabetes follow up   Hypertension Patient's current antihypertensive  medications include: Cozaar 50mg . Compliant with medications and tolerating well without side effects. Denies any SOB, CP, vision changes, LE edema, medication SEs, or symptoms of hypotension.   Most recent creatinine trend:  Lab Results  Component Value Date   CREATININE 0.82 04/02/2020   CREATININE 0.98 11/27/2018   CREATININE 0.86 09/14/2018   Patient has had a BMP in the past 1 year.  Prediabetes  CBG readings averaging in the 120-140s range. A1c today 6.1. Denies abdominal pain, blurred vision, polyuria, polydipsia, hypoglycemia. Patient states they understand that diet and exercise can help with her diabetes. Pt is now going to the gym daily.  Last Microalbumin, LDL, Creatinine: Lab Results  Component Value Date   MICROALBUR 20 11/27/2018   LDLCALC 74 04/02/2020   CREATININE 0.82 04/02/2020   HLD Patient is currently taking 10mg  Atorvastatin. Endorses compliance and tolerating well without side effects. Denies RUQ pain or myalgias.   Bilateral neck lumps  Pt reports bilateral submandibular neck lumps which have been present for the last few years. They have not increased in size but is concerned about them. The lumps do not hurt. Denies fevers, weight loss, night sweats, anorexia, dysphagia, dyspnea etc. No hx of thyroid disorders.   [place] Office Visit from 02/19/2021 in [place]  PHQ-9 Total Score 2       PERTINENT  PMH / PSH: prediabetes  OBJECTIVE:   BP 130/68    Pulse 76    Ht 5' (1.524 m)    Wt 136 lb 4 oz (61.8 kg)    LMP  (LMP Unknown)    SpO2 99%    BMI 26.61 kg/m    General: Alert, no acute distress HEENT: NCAT, 3cm round/oval, firm, non fluctuant, submandibular masses bilaterally, non tender on palpation, no pharyngeal edema or erythema, erythema, normal  TMs Cardio: well perfused  Pulm: normal work of breathing Neuro: Cranial nerves grossly intact   ASSESSMENT/PLAN:   HTN (hypertension) Bp at goal 130/68. Continue medications at current doses. Obtained BMP today.   Hyperlipidemia Obtained lipid panel. Continue atorvastatin.  Prediabetes A1c 6.1 Repeat in 6 months-1 year.  Healthcare maintenance Pt received PNA and tetanus vaccine today. She will get shingles vaccine at her pharmacy.  Localized swelling, mass or lump of neck Broad differential for neck masses including thyroid goitre, benign tumor such as salivary gland adenoma etc. Reactive lymphadenopathy less likely given lack of infective symptoms and long standing history. Loss suspicion for malignancy given lack of red flag symptoms and long standing history. Will obtain TSH and US soft tissue head and neck. If [place] inconclusive can consider CT head and neck in the future. Pt will follow up after results.      Lattie Haw, MD PGY-3 [place]

## 2021-02-19 ENCOUNTER — Other Ambulatory Visit: Payer: Self-pay

## 2021-02-19 ENCOUNTER — Encounter: Payer: Self-pay | Admitting: Family Medicine

## 2021-02-19 ENCOUNTER — Ambulatory Visit (INDEPENDENT_AMBULATORY_CARE_PROVIDER_SITE_OTHER): Payer: No Typology Code available for payment source | Admitting: Family Medicine

## 2021-02-19 VITALS — BP 130/68 | HR 76 | Ht 60.0 in | Wt 136.2 lb

## 2021-02-19 DIAGNOSIS — R221 Localized swelling, mass and lump, neck: Secondary | ICD-10-CM

## 2021-02-19 DIAGNOSIS — Z0000 Encounter for general adult medical examination without abnormal findings: Secondary | ICD-10-CM | POA: Diagnosis not present

## 2021-02-19 DIAGNOSIS — Z23 Encounter for immunization: Secondary | ICD-10-CM | POA: Diagnosis not present

## 2021-02-19 DIAGNOSIS — E119 Type 2 diabetes mellitus without complications: Secondary | ICD-10-CM | POA: Diagnosis not present

## 2021-02-19 DIAGNOSIS — I10 Essential (primary) hypertension: Secondary | ICD-10-CM

## 2021-02-19 DIAGNOSIS — R7303 Prediabetes: Secondary | ICD-10-CM

## 2021-02-19 DIAGNOSIS — E785 Hyperlipidemia, unspecified: Secondary | ICD-10-CM | POA: Diagnosis not present

## 2021-02-19 LAB — POCT GLYCOSYLATED HEMOGLOBIN (HGB A1C): HbA1c, POC (prediabetic range): 6.1 % (ref 5.7–6.4)

## 2021-02-19 MED ORDER — DICLOFENAC SODIUM 1 % EX GEL: 2.0000 g | Freq: Four times a day (QID) | CUTANEOUS | 0 refills | Status: DC

## 2021-02-19 NOTE — Assessment & Plan Note (Signed)
A1c 6.1 Repeat in 6 months-1 year.

## 2021-02-19 NOTE — Assessment & Plan Note (Signed)
Pt received PNA and tetanus vaccine today. She will get shingles vaccine at her pharmacy.

## 2021-02-19 NOTE — Assessment & Plan Note (Signed)
Bp at goal 130/68. Continue medications at current doses. Obtained BMP today.

## 2021-02-19 NOTE — Patient Instructions (Signed)
Thank you for coming to see me today. It was a pleasure. Today we discussed the lumps in your neck, I do not think it is anything sinister. I recommend the neck ultrasound to investigate. Keep up the great work with your exercise, you are not diabetic. You have prediabetes.  Please get shingles vaccine at pharmacy.  We will get some labs today.  If they are abnormal or we need to do something about them, I will call you.  If they are normal, I will send you a message on MyChart (if it is active) or a letter in the mail.  If you don't hear from [place] in 2 weeks, please call the office at the number below.   Please follow-up with me after the neck ultrasound.  If you have any questions or concerns, please do not hesitate to call the office at [phone].  Best wishes,   Dr Allena Katz

## 2021-02-19 NOTE — Assessment & Plan Note (Signed)
Obtained lipid panel. Continue atorvastatin.

## 2021-02-20 ENCOUNTER — Ambulatory Visit
Admission: RE | Admit: 2021-02-20 | Discharge: 2021-02-20 | Disposition: A | Discharge status: Home / Self Care | Payer: No Typology Code available for payment source | Source: Ambulatory Visit | Attending: Family Medicine | Admitting: Family Medicine

## 2021-02-20 DIAGNOSIS — R221 Localized swelling, mass and lump, neck: Secondary | ICD-10-CM | POA: Diagnosis not present

## 2021-02-20 NOTE — Assessment & Plan Note (Addendum)
Broad differential for neck masses including thyroid goitre, benign tumor such as salivary gland adenoma etc. Reactive lymphadenopathy less likely given lack of infective symptoms and long standing history. Loss suspicion for malignancy given lack of red flag symptoms and long standing history. Will obtain TSH and US soft tissue head and neck. If [place] inconclusive can consider CT head and neck in the future. Pt will follow up after results.

## 2021-03-05 DIAGNOSIS — H43813 Vitreous degeneration, bilateral: Secondary | ICD-10-CM | POA: Diagnosis not present

## 2021-03-05 DIAGNOSIS — H35033 Hypertensive retinopathy, bilateral: Secondary | ICD-10-CM | POA: Diagnosis not present

## 2021-03-05 DIAGNOSIS — H04123 Dry eye syndrome of bilateral lacrimal glands: Secondary | ICD-10-CM | POA: Diagnosis not present

## 2021-03-05 DIAGNOSIS — H25813 Combined forms of age-related cataract, bilateral: Secondary | ICD-10-CM | POA: Diagnosis not present

## 2021-03-26 ENCOUNTER — Other Ambulatory Visit: Payer: Self-pay | Admitting: Family Medicine

## 2021-03-27 ENCOUNTER — Telehealth: Payer: Self-pay

## 2021-03-27 NOTE — Telephone Encounter (Signed)
Patient calls nurse line regarding exposure to meningitis. Unsure if this is viral or bacterial.  Patient states that her son tested positive early this AM and is hospitalized in [place].   She was exposed to son last Tuesday. Patient lives alone, however, does report recent traveling. Patient is currently asymptomatic, however, would like provider recommendations on how she should proceed, given exposure.   Spoke with preceptor (McDiarmid). Recommended that patient get more information as to what type of meningitis. Instructed patient to reach out to son's provider in [place] to get this information. Patient will call back with update.   Veronda Prude, RN

## 2021-03-30 NOTE — Telephone Encounter (Signed)
Patient returns call to nurse line. Reports that son was diagnosed with pneumococcal meningitis. Patient is requesting provider recommendations on prophylactic treatment.   Patient is currently asymptomatic. Please advise.   Veronda Prude, RN

## 2021-03-31 NOTE — Telephone Encounter (Signed)
Called pt regarding pneumococcal meningitis exposure. She denies any symptoms currently. I explained that she does not need prophylaxis following exposure to this bacteria (precepted with Dr Andria Frames).

## 2021-03-31 NOTE — Telephone Encounter (Signed)
Patient calling back to follow up on yesterdays call.  Will forward to MD.  Stacy Nguyen

## 2021-05-06 DIAGNOSIS — H25813 Combined forms of age-related cataract, bilateral: Secondary | ICD-10-CM | POA: Diagnosis not present

## 2021-05-06 DIAGNOSIS — H04123 Dry eye syndrome of bilateral lacrimal glands: Secondary | ICD-10-CM | POA: Diagnosis not present

## 2021-05-06 DIAGNOSIS — H35033 Hypertensive retinopathy, bilateral: Secondary | ICD-10-CM | POA: Diagnosis not present

## 2021-05-06 DIAGNOSIS — H43813 Vitreous degeneration, bilateral: Secondary | ICD-10-CM | POA: Diagnosis not present

## 2021-06-05 DIAGNOSIS — H25811 Combined forms of age-related cataract, right eye: Secondary | ICD-10-CM | POA: Diagnosis not present

## 2021-06-10 ENCOUNTER — Other Ambulatory Visit: Payer: Self-pay | Admitting: Family Medicine

## 2021-06-10 ENCOUNTER — Ambulatory Visit (INDEPENDENT_AMBULATORY_CARE_PROVIDER_SITE_OTHER): Payer: No Typology Code available for payment source | Admitting: Family Medicine

## 2021-06-10 ENCOUNTER — Ambulatory Visit
Admission: RE | Admit: 2021-06-10 | Discharge: 2021-06-10 | Disposition: A | Discharge status: Home / Self Care | Payer: No Typology Code available for payment source | Source: Ambulatory Visit | Attending: Family Medicine | Admitting: Family Medicine

## 2021-06-10 ENCOUNTER — Encounter: Payer: Self-pay | Admitting: Family Medicine

## 2021-06-10 VITALS — BP 150/74 | HR 65 | Ht 61.0 in | Wt 142.0 lb

## 2021-06-10 DIAGNOSIS — M25551 Pain in right hip: Secondary | ICD-10-CM

## 2021-06-10 DIAGNOSIS — R221 Localized swelling, mass and lump, neck: Secondary | ICD-10-CM | POA: Diagnosis not present

## 2021-06-10 DIAGNOSIS — I10 Essential (primary) hypertension: Secondary | ICD-10-CM

## 2021-06-10 DIAGNOSIS — E785 Hyperlipidemia, unspecified: Secondary | ICD-10-CM

## 2021-06-10 MED ORDER — NAPROXEN 500 MG PO TABS: 500.0000 mg | ORAL_TABLET | Freq: Two times a day (BID) | ORAL | 0 refills | Status: DC

## 2021-06-10 NOTE — Patient Instructions (Signed)
It was great to see you! ? ?I have ordered an x-ray of your hip. You can go to [facility] ([address]) any time between 7am-7pm to have this done. No appointment needed.  ? ?In the meantime, I have sent a prescription anti-inflammatory medication called Naproxen to your pharmacy. Take one tablet twice daily for the next 1 week. DO NOT take Ibuprofen/Motrin/Aleve/Advil while you're on this medication. ? ?If your pain does not improve, let us know. ? ?Take care and seek immediate care sooner if you develop any concerns. ? ?Dr. Anner Crete ?[facility]  ?

## 2021-06-10 NOTE — Progress Notes (Addendum)
? ? ?  SUBJECTIVE:  ? ?CHIEF COMPLAINT / HPI:  ? ?R Hip Pain ?Woke up this morning with pain ?Located mostly in lateral right hip and superior buttocks region ?Pain is constant but significantly worse with weight bearing ?Difficulty walking today secondary to pain ?No trauma, fall or other injury ?Used [employer] & took Tylenol 1000mg  with no relief ?Pain feels "deep", no pain when she palpates the area ?No numbness/tingling in leg ?No systemic symptoms ?Of note, she has had anterior right hip pain which she describes as groin pain for 2 months, onset coincided with her starting to exercise at the gym (does treadmill, stationary bike, leg machines) ? ? ?PERTINENT  PMH / PSH: osteopenia ? ?OBJECTIVE:  ? ?BP (!) 150/74   Pulse 65   Ht 5\' 1"  (1.549 m)   Wt 142 lb (64.4 kg)   LMP  (LMP Unknown)   BMI 26.83 kg/m?   ?General: appears to be in significant pain, pleasant ?Respiratory: normal respiratory effort ?Skin: warm and dry, no rashes noted ?MSK: R hip-- inspection normal, no tenderness over greater trochanteric bursa, pain with weight bearing, unable to perform single leg stand on R, normal passive and active ROM with flexion and extension, 5/5 strength with resisted flexion, pain with thigh abduction ?Back: no midline tenderness, mild tenderness to palpation of SI joint on right, full ROM of back with flexion, extension, Negative straight leg raise ?Neuro: sensation intact in bilateral lower extremities ? ? ?ASSESSMENT/PLAN:  ? ?Right hip pain ?Acute onset this morning on background of 4 months of more chronic anterior hip pain. ?Most likely OA flare vs SI joint dysfunction. Consider disc etiology given acute onset although no radicular symptoms. ?-Obtain R hip x-rays ?-Naproxen 500mg  BID x10 days ?-Consider PT when acute pain is improving ? ?Blood Work ?PCP previously ordered BMP, lipid panel, and TSH at last visit but they were not collected. Will obtain today.  ? ? ?Alcus Dad, MD ?[place]  ?

## 2021-06-11 DIAGNOSIS — M25551 Pain in right hip: Secondary | ICD-10-CM | POA: Insufficient documentation

## 2021-06-11 LAB — BASIC METABOLIC PANEL
BUN/Creatinine Ratio: 16 (ref 12–28)
BUN: 14 mg/dL (ref 8–27)
CO2: 25 mmol/L (ref 20–29)
Calcium: 9.7 mg/dL (ref 8.7–10.3)
Chloride: 105 mmol/L (ref 96–106)
Creatinine, Ser: 0.86 mg/dL (ref 0.57–1.00)
Glucose: 94 mg/dL (ref 70–99)
Potassium: 4.3 mmol/L (ref 3.5–5.2)
Sodium: 143 mmol/L (ref 134–144)
eGFR: 74 mL/min/{1.73_m2} (ref >59)

## 2021-06-11 LAB — LIPID PANEL
Chol/HDL Ratio: 2.7 ratio (ref 0.0–4.4)
Cholesterol, Total: 146 mg/dL (ref 100–199)
HDL: 55 mg/dL (ref >39)
LDL Chol Calc (NIH): 76 mg/dL (ref 0–99)
Triglycerides: 75 mg/dL (ref 0–149)
VLDL Cholesterol Cal: 15 mg/dL (ref 5–40)

## 2021-06-11 LAB — TSH: TSH: 0.931 u[IU]/mL (ref 0.450–4.500)

## 2021-06-11 NOTE — Assessment & Plan Note (Signed)
Acute onset this morning on background of 4 months of more chronic anterior hip pain. ?Most likely OA flare vs SI joint dysfunction. Consider disc etiology given acute onset although no radicular symptoms. ?-Obtain R hip x-rays ?-Naproxen 500mg  BID x10 days ?-Consider PT when acute pain is improving ?

## 2021-06-15 ENCOUNTER — Other Ambulatory Visit: Payer: Self-pay | Admitting: Family Medicine

## 2021-06-25 DIAGNOSIS — H2512 Age-related nuclear cataract, left eye: Secondary | ICD-10-CM | POA: Diagnosis not present

## 2021-06-26 DIAGNOSIS — H25812 Combined forms of age-related cataract, left eye: Secondary | ICD-10-CM | POA: Diagnosis not present

## 2021-06-30 ENCOUNTER — Encounter: Payer: Self-pay | Admitting: Internal Medicine

## 2021-07-07 ENCOUNTER — Other Ambulatory Visit: Payer: Self-pay | Admitting: Family Medicine

## 2021-07-07 DIAGNOSIS — M25551 Pain in right hip: Secondary | ICD-10-CM

## 2021-07-14 ENCOUNTER — Encounter: Payer: Self-pay | Admitting: *Deleted

## 2021-07-21 ENCOUNTER — Ambulatory Visit (AMBULATORY_SURGERY_CENTER): Payer: Self-pay | Admitting: *Deleted

## 2021-07-21 VITALS — Ht 60.0 in | Wt 146.2 lb

## 2021-07-21 DIAGNOSIS — Z1211 Encounter for screening for malignant neoplasm of colon: Secondary | ICD-10-CM

## 2021-07-21 MED ORDER — NA SULFATE-K SULFATE-MG SULF 17.5-3.13-1.6 GM/177ML PO SOLN: 1.0000 | Freq: Once | ORAL | 0 refills | Status: AC

## 2021-07-21 NOTE — Progress Notes (Signed)

## 2021-07-23 ENCOUNTER — Emergency Department (HOSPITAL_COMMUNITY)
Admission: EM | Admit: 2021-07-23 | Discharge: 2021-07-24 | Disposition: A | Discharge status: Home / Self Care | Payer: No Typology Code available for payment source | Attending: Emergency Medicine | Admitting: Emergency Medicine

## 2021-07-23 ENCOUNTER — Emergency Department (HOSPITAL_COMMUNITY): Payer: No Typology Code available for payment source

## 2021-07-23 ENCOUNTER — Telehealth: Payer: Self-pay

## 2021-07-23 DIAGNOSIS — I10 Essential (primary) hypertension: Secondary | ICD-10-CM

## 2021-07-23 DIAGNOSIS — Z79899 Other long term (current) drug therapy: Secondary | ICD-10-CM | POA: Insufficient documentation

## 2021-07-23 DIAGNOSIS — R519 Headache, unspecified: Secondary | ICD-10-CM | POA: Diagnosis not present

## 2021-07-23 DIAGNOSIS — G319 Degenerative disease of nervous system, unspecified: Secondary | ICD-10-CM | POA: Diagnosis not present

## 2021-07-23 DIAGNOSIS — R42 Dizziness and giddiness: Secondary | ICD-10-CM

## 2021-07-23 LAB — CBC WITH DIFFERENTIAL/PLATELET
Abs Immature Granulocytes: 0.01 10*3/uL (ref 0.00–0.07)
Basophils Absolute: 0 10*3/uL (ref 0.0–0.1)
Basophils Relative: 0 %
Eosinophils Absolute: 0 10*3/uL (ref 0.0–0.5)
Eosinophils Relative: 1 %
HCT: 43 % (ref 36.0–46.0)
Hemoglobin: 13.5 g/dL (ref 12.0–15.0)
Immature Granulocytes: 0 %
Lymphocytes Relative: 22 %
Lymphs Abs: 1.1 10*3/uL (ref 0.7–4.0)
MCH: 28.1 pg (ref 26.0–34.0)
MCHC: 31.4 g/dL (ref 30.0–36.0)
MCV: 89.6 fL (ref 80.0–100.0)
Monocytes Absolute: 0.2 10*3/uL (ref 0.1–1.0)
Monocytes Relative: 5 %
Neutro Abs: 3.5 10*3/uL (ref 1.7–7.7)
Neutrophils Relative %: 72 %
Platelets: 152 10*3/uL (ref 150–400)
RBC: 4.8 MIL/uL (ref 3.87–5.11)
RDW: 13.3 % (ref 11.5–15.5)
WBC: 4.9 10*3/uL (ref 4.0–10.5)
nRBC: 0 % (ref 0.0–0.2)

## 2021-07-23 LAB — URINALYSIS, ROUTINE W REFLEX MICROSCOPIC
Bilirubin Urine: NEGATIVE
Glucose, UA: NEGATIVE mg/dL
Hgb urine dipstick: NEGATIVE
Ketones, ur: NEGATIVE mg/dL
Leukocytes,Ua: NEGATIVE
Nitrite: NEGATIVE
Protein, ur: NEGATIVE mg/dL
Specific Gravity, Urine: 1.005 (ref 1.005–1.030)
pH: 8 (ref 5.0–8.0)

## 2021-07-23 LAB — BASIC METABOLIC PANEL
Anion gap: 6 (ref 5–15)
BUN: 11 mg/dL (ref 8–23)
CO2: 23 mmol/L (ref 22–32)
Calcium: 9.5 mg/dL (ref 8.9–10.3)
Chloride: 111 mmol/L (ref 98–111)
Creatinine, Ser: 0.87 mg/dL (ref 0.44–1.00)
GFR, Estimated: >60 mL/min (ref >60)
Glucose, Bld: 119 mg/dL — ABNORMAL HIGH (ref 70–99)
Potassium: 3.8 mmol/L (ref 3.5–5.1)
Sodium: 140 mmol/L (ref 135–145)

## 2021-07-23 MED ORDER — FENTANYL CITRATE PF 50 MCG/ML IJ SOSY
50.0000 ug | PREFILLED_SYRINGE | Freq: Once | INTRAMUSCULAR | Status: AC
  Administered 2021-07-23: 50 ug via INTRAVENOUS
  Filled 2021-07-23: qty 1

## 2021-07-23 MED ORDER — MECLIZINE HCL 25 MG PO TABS
25.0000 mg | ORAL_TABLET | Freq: Once | ORAL | Status: AC
  Administered 2021-07-23: 25 mg via ORAL
  Filled 2021-07-23: qty 1

## 2021-07-23 NOTE — ED Provider Notes (Signed)
[facility] EMERGENCY DEPARTMENT Provider Note   CSN: [id number] Arrival date & time: 07/23/21  1653     History  Chief Complaint  Patient presents with   Hypertension    Stacy Nguyen is a 66 y.o. female.  Patient is a 66 year old female who presents with headache and dizziness with elevated blood pressure.  She said this morning she seemed fine.  She was doing some work around the house and then became dizzy.  She feels like there is a spinning sensation but it is also worse when she stands up.  She denies any associated nausea or vomiting.  She has a headache and pressure in her bifrontal area and behind her eyes.  She denies any recent vision changes.  She has had some recent cataract surgery about 2 or so weeks ago per her report.  She said her abrasions been progressively getting better and does not have any worsening or change in this today.  No speech deficits.  No numbness or weakness to her extremities.  No slurred speech.  She says she has had dizziness before and it feels similar.  She checked her blood pressure and it was high around 300 systolic.  EMS reports that it was 226/98.  She takes losartan in the evenings and says she has been compliant with her medications.  She is also had some recent crowns placed.  She has had some worsening tooth pain in her left lower crown.  She went to the dentist yesterday and was started on amoxicillin.  She denies any facial swelling.  No fevers.       Home Medications Prior to Admission medications   Medication Sig Start Date End Date Taking? Authorizing Provider  acetaminophen (TYLENOL) 500 MG tablet Take 500 mg by mouth every 6 (six) hours as needed for mild pain, moderate pain or headache.   Yes [provider]  amoxicillin (AMOXIL) 500 MG capsule Take 500 mg by mouth 3 (three) times daily. 07/22/21  Yes [provider]  atorvastatin (LIPITOR) 10 MG tablet TAKE 1 TABLET(10 MG) BY MOUTH DAILY 10/14/20   Yes Towanda Octave, MD  citalopram (CELEXA) 20 MG tablet TAKE 1 TABLET(20 MG) BY MOUTH DAILY 06/15/21  Yes Towanda Octave, MD  diclofenac Sodium (VOLTAREN) 1 % GEL Apply 2 g topically 4 (four) times daily. Patient taking differently: Apply 2 g topically 4 (four) times daily as needed (pain). 02/19/21  Yes Towanda Octave, MD  losartan (COZAAR) 50 MG tablet TAKE 1 TABLET(50 MG) BY MOUTH DAILY 01/12/21  Yes Autry-Lott, Randa Evens, DO  naproxen (NAPROSYN) 500 MG tablet TAKE 1 TABLET(500 MG) BY MOUTH TWICE DAILY WITH A MEAL Patient taking differently: Take 500 mg by mouth daily as needed for moderate pain. 07/07/21  Yes Towanda Octave, MD  hydrocortisone (ANUSOL-HC) 2.5 % rectal cream Place 1 application rectally 2 (two) times daily. Patient not taking: Reported on 07/21/2021 05/06/20   Towanda Octave, MD  ketorolac (ACULAR) 0.5 % ophthalmic solution Place 1 drop into the left eye in the morning, at noon, and at bedtime. 06/29/21   [provider]  polyethylene glycol powder (GLYCOLAX/MIRALAX) 17 GM/SCOOP powder Take 17 g by mouth 2 (two) times daily as needed. Patient not taking: Reported on 07/21/2021 05/06/20   Towanda Octave, MD  prednisoLONE acetate (PRED FORTE) 1 % ophthalmic suspension Place 1 drop into the right eye in the morning, at noon, and at bedtime. 06/28/21   [provider]      Allergies  Lisinopril    Review of Systems   Review of Systems  Constitutional:  Positive for fatigue. Negative for chills, diaphoresis and fever.  HENT:  Positive for dental problem. Negative for congestion, rhinorrhea and sneezing.   Eyes:  Positive for photophobia and pain. Negative for discharge, redness and visual disturbance.  Respiratory:  Negative for cough, chest tightness and shortness of breath.   Cardiovascular:  Negative for chest pain and leg swelling.  Gastrointestinal:  Negative for abdominal pain, blood in stool, diarrhea, nausea and vomiting.  Genitourinary:  Negative for difficulty  urinating, flank pain, frequency and hematuria.  Musculoskeletal:  Negative for arthralgias and back pain.  Skin:  Negative for rash.  Neurological:  Positive for dizziness and headaches. Negative for speech difficulty, weakness and numbness.    Physical Exam Updated Vital Signs BP (!) 178/79   Pulse (!) 107   Temp 98.3 F (36.8 C)   Resp 16   LMP  (LMP Unknown)   SpO2 100%  Physical Exam Constitutional:      Appearance: She is well-developed.  HENT:     Head: Normocephalic and atraumatic.  Eyes:     Extraocular Movements: Extraocular movements intact.     Conjunctiva/sclera: Conjunctivae normal.     Pupils: Pupils are equal, round, and reactive to light.     Comments: Patient has limited eye opening which is chronic for her.  No obvious nystagmus is noted no drainage from the eyes.  No redness.  Cardiovascular:     Rate and Rhythm: Normal rate and regular rhythm.     Heart sounds: Normal heart sounds.  Pulmonary:     Effort: Pulmonary effort is normal. No respiratory distress.     Breath sounds: Normal breath sounds. No wheezing or rales.  Chest:     Chest wall: No tenderness.  Abdominal:     General: Bowel sounds are normal.     Palpations: Abdomen is soft.     Tenderness: There is no abdominal tenderness. There is no guarding or rebound.  Musculoskeletal:        General: Normal range of motion.     Cervical back: Normal range of motion and neck supple.  Lymphadenopathy:     Cervical: No cervical adenopathy.  Skin:    General: Skin is warm and dry.     Findings: No rash.  Neurological:     Mental Status: She is alert and oriented to person, place, and time.     Comments: Motor 5/5 all extremities Sensation grossly intact to LT all extremities Finger to Nose intact, no pronator drift CN II-XII grossly intact       ED Results / Procedures / Treatments   Labs (all labs ordered are listed, but only abnormal results are displayed) Labs Reviewed  BASIC  METABOLIC PANEL - Abnormal; Notable for the following components:      Result Value   Glucose, Bld 119 (*)    All other components within normal limits  URINALYSIS, ROUTINE W REFLEX MICROSCOPIC - Abnormal; Notable for the following components:   Color, Urine STRAW (*)    All other components within normal limits  CBC WITH DIFFERENTIAL/PLATELET    EKG None  Radiology CT Head Wo Contrast  Result Date: 07/23/2021 CLINICAL DATA:  Dizziness.  Headache. EXAM: CT HEAD WITHOUT CONTRAST TECHNIQUE: Contiguous axial images were obtained from the base of the skull through the vertex without intravenous contrast. RADIATION DOSE REDUCTION: This exam was performed according to the departmental dose-optimization program which  includes automated exposure control, adjustment of the mA and/or kV according to patient size and/or use of iterative reconstruction technique. COMPARISON:  None Available. FINDINGS: Brain: No evidence of acute infarction, hemorrhage, hydrocephalus, extra-axial collection or mass lesion/mass effect. Vascular: No hyperdense vessel or unexpected calcification. Skull: Normal. Negative for fracture or focal lesion. Sinuses/Orbits: No acute finding. Other: None. IMPRESSION: No acute intracranial abnormality seen. Electronically Signed   By: Lupita Raider M.D.   On: 07/23/2021 19:02    Procedures Procedures    Medications Ordered in ED Medications  meclizine (ANTIVERT) tablet 25 mg (25 mg Oral Given 07/23/21 1749)  fentaNYL (SUBLIMAZE) injection 50 mcg (50 mcg Intravenous Given 07/23/21 1746)    ED Course/ Medical Decision Making/ A&P                           Medical Decision Making Amount and/or Complexity of Data Reviewed Labs: ordered. Radiology: ordered.  Risk Prescription drug management.   Patient is a 66 year old female who presents with markedly elevated blood pressures prior to arrival in association with some head pressure and dizziness.  She does not have  nystagmus.  No obvious focal neurologic deficits.  Her blood pressure has improved upon arrival to the ED.  Her EKG was reviewed and does not show any acute ischemic changes or arrhythmias.  This was interpreted by me.  She was given medication for her headache.  She does not have any vision changes.  Head CT showed no acute abnormality.  Her labs are nonconcerning.  She is awaiting MRI.  If this is normal and she is feeling better, can likely be discharged.  Care turned over to Dr. Bebe Shaggy pending this reassessment.  Final Clinical Impression(s) / ED Diagnoses Final diagnoses:  Hypertension, unspecified type  Acute nonintractable headache, unspecified headache type    Rx / DC Orders ED Discharge Orders     None         Rolan Bucco, MD 07/23/21 2339

## 2021-07-23 NOTE — ED Triage Notes (Signed)
Pt via [facility] from home, feeling "dizzy, HA, pressure behind the eyes" since this AM, severely hypertensive w fire (300 systolic), EMS reports 226/98. Pt denies slurred speech, no weakness or unilateral deficit noted   20LAC, no meds PTA

## 2021-07-23 NOTE — Telephone Encounter (Signed)
Patient LVM on nurse line regarding possible medication reaction. Patient reports that she was started on amoxicillin by dentist and is now experiencing lightheadedness and dizziness.   Called patient back. Patient reports that she called 911 due to these symptoms and having high blood pressure and headache. Patient was being transported to the ED at time of conversation.   FYI to PCP.   Veronda Prude, RN

## 2021-07-24 MED ORDER — MECLIZINE HCL 25 MG PO TABS
25.0000 mg | ORAL_TABLET | Freq: Once | ORAL | Status: AC
  Administered 2021-07-24: 25 mg via ORAL
  Filled 2021-07-24: qty 1

## 2021-07-24 MED ORDER — MECLIZINE HCL 25 MG PO TABS: 25.0000 mg | ORAL_TABLET | Freq: Three times a day (TID) | ORAL | 0 refills | Status: DC | PRN

## 2021-07-24 NOTE — ED Provider Notes (Signed)
I assumed care in signout to follow-up on MRI.  MRI is negative.  Blood pressure is improving.  Patient reports feeling improved.  Suspect elevation in her BP due to dental pain and recent antibiotics. Since her BP is improving, will defer changes in meds.  However she will check her blood pressure each day and then call her PCP early next week if it continues to be elevated for potential medication adjustment   Zadie Rhine, MD 07/24/21 [phone]

## 2021-07-24 NOTE — Telephone Encounter (Signed)
Scheduled patient for follow up Friday, 6/23.   Veronda Prude, RN

## 2021-07-24 NOTE — Telephone Encounter (Signed)
Thank you, pt was seen in the ED and dx with vertigo. Pt could you have the patient follow up with me in clinic? Thank you

## 2021-07-24 NOTE — ED Provider Notes (Signed)
Patient was able to ambulate with a steady gait, but did report increasing dizziness whenever she turns her head.  Suspect acute vertigo Will give antivert Patient intends to follow-up with her PCP soon for reassessment   Zadie Rhine, MD 07/24/21 0104

## 2021-07-24 NOTE — Discharge Instructions (Addendum)

## 2021-07-30 DIAGNOSIS — Z961 Presence of intraocular lens: Secondary | ICD-10-CM | POA: Diagnosis not present

## 2021-07-30 DIAGNOSIS — H5213 Myopia, bilateral: Secondary | ICD-10-CM | POA: Diagnosis not present

## 2021-07-31 ENCOUNTER — Other Ambulatory Visit: Payer: Self-pay

## 2021-07-31 ENCOUNTER — Encounter: Payer: Self-pay | Admitting: Family Medicine

## 2021-07-31 ENCOUNTER — Ambulatory Visit (INDEPENDENT_AMBULATORY_CARE_PROVIDER_SITE_OTHER): Payer: No Typology Code available for payment source | Admitting: Family Medicine

## 2021-07-31 VITALS — BP 165/85 | HR 67 | Wt 143.6 lb

## 2021-07-31 DIAGNOSIS — R7303 Prediabetes: Secondary | ICD-10-CM

## 2021-07-31 DIAGNOSIS — R42 Dizziness and giddiness: Secondary | ICD-10-CM

## 2021-07-31 DIAGNOSIS — I10 Essential (primary) hypertension: Secondary | ICD-10-CM

## 2021-07-31 LAB — POCT GLYCOSYLATED HEMOGLOBIN (HGB A1C): HbA1c, POC (controlled diabetic range): 6.2 % (ref 0.0–7.0)

## 2021-08-03 DIAGNOSIS — R42 Dizziness and giddiness: Secondary | ICD-10-CM | POA: Insufficient documentation

## 2021-08-03 HISTORY — DX: Dizziness and giddiness: R42

## 2021-08-04 DIAGNOSIS — H52221 Regular astigmatism, right eye: Secondary | ICD-10-CM | POA: Diagnosis not present

## 2021-08-04 DIAGNOSIS — H524 Presbyopia: Secondary | ICD-10-CM | POA: Diagnosis not present

## 2021-08-14 ENCOUNTER — Ambulatory Visit: Payer: No Typology Code available for payment source | Admitting: Family Medicine

## 2021-08-18 ENCOUNTER — Encounter: Payer: Self-pay | Admitting: Internal Medicine

## 2021-08-18 ENCOUNTER — Ambulatory Visit (AMBULATORY_SURGERY_CENTER): Payer: No Typology Code available for payment source | Admitting: Internal Medicine

## 2021-08-18 VITALS — BP 120/81 | HR 52 | Temp 97.5°F | Resp 9 | Ht 61.0 in | Wt 146.2 lb

## 2021-08-18 DIAGNOSIS — Z1211 Encounter for screening for malignant neoplasm of colon: Secondary | ICD-10-CM

## 2021-08-18 DIAGNOSIS — F32A Depression, unspecified: Secondary | ICD-10-CM | POA: Diagnosis not present

## 2021-08-18 DIAGNOSIS — E785 Hyperlipidemia, unspecified: Secondary | ICD-10-CM | POA: Diagnosis not present

## 2021-08-18 DIAGNOSIS — I10 Essential (primary) hypertension: Secondary | ICD-10-CM | POA: Diagnosis not present

## 2021-08-18 DIAGNOSIS — F419 Anxiety disorder, unspecified: Secondary | ICD-10-CM | POA: Diagnosis not present

## 2021-08-18 DIAGNOSIS — E119 Type 2 diabetes mellitus without complications: Secondary | ICD-10-CM | POA: Diagnosis not present

## 2021-08-18 MED ORDER — SODIUM CHLORIDE 0.9 % IV SOLN: 500.0000 mL | Freq: Once | INTRAVENOUS | Status: DC

## 2021-08-18 NOTE — Op Note (Signed)
[facility] Patient Name: Stacy Nguyen Procedure Date: 08/18/2021 11:47 AM MRN: [id number] Endoscopist: Nicole Kindred "Stacy Nguyen ,  Age: 66 Referring MD:  Date of Birth: 06-02-1955 Gender: Female Account #: [id number] Procedure:                Colonoscopy Indications:              Screening for colorectal malignant neoplasm Medicines:                Monitored Anesthesia Care Procedure:                Pre-Anesthesia Assessment:                           - Prior to the procedure, a History and Physical                            was performed, and patient medications and                            allergies were reviewed. The patient's tolerance of                            previous anesthesia was also reviewed. The risks                            and benefits of the procedure and the sedation                            options and risks were discussed with the patient.                            All questions were answered, and informed consent                            was obtained. Prior Anticoagulants: The patient has                            taken no previous anticoagulant or antiplatelet                            agents. ASA Grade Assessment: III - A patient with                            severe systemic disease. After reviewing the risks                            and benefits, the patient was deemed in                            satisfactory condition to undergo the procedure.                           After obtaining informed consent, the colonoscope  was passed under direct vision. Throughout the                            procedure, the patient's blood pressure, pulse, and                            oxygen saturations were monitored continuously. The                            Olympus CF-HQ190L ([id number]) Colonoscope was                            introduced through the anus and advanced to the the                            terminal  ileum. The colonoscopy was performed                            without difficulty. The patient tolerated the                            procedure well. Retroflexion was not able to be                            performed due to narrow rectal vault. Scope In: 11:51:34 AM Scope Out: 12:13:50 PM Scope Withdrawal Time: 0 hours 12 minutes 21 seconds  Total Procedure Duration: 0 hours 22 minutes 16 seconds  Findings:                 The terminal ileum appeared normal.                           Multiple small and large-mouthed diverticula were                            found in the sigmoid colon.                           Non-bleeding internal hemorrhoids were found during                            retroflexion. Complications:            No immediate complications. Estimated Blood Loss:     Estimated blood loss: none. Impression:               - The examined portion of the ileum was normal.                           - Diverticulosis in the sigmoid colon.                           - Non-bleeding internal hemorrhoids.                           - No specimens collected. Recommendation:           -  Discharge patient to home (with escort).                           - Repeat colonoscopy in 10 years for screening                            purposes.                           - The findings and recommendations were discussed                            with the patient. Stacy Nguyen "Stacy Nguyen,  08/18/2021 12:17:41 PM

## 2021-08-18 NOTE — Progress Notes (Signed)
A and O x3. Report to RN. Tolerated MAC anesthesia well. 

## 2021-08-18 NOTE — Progress Notes (Signed)
GASTROENTEROLOGY PROCEDURE H&P NOTE   Primary Care Physician: [name], DO    Reason for Procedure:   Colon cancer screening  Plan:    Colonoscopy  Patient is appropriate for endoscopic procedure(s) in the ambulatory ([facility]) setting.  The nature of the procedure, as well as the risks, benefits, and alternatives were carefully and thoroughly reviewed with the patient. Ample time for discussion and questions allowed. The patient understood, was satisfied, and agreed to proceed.     HPI: Stacy Nguyen is a 66 y.o. female who presents for colonoscopy for colon cancer screening. Denies blood in stools, changes in bowel habits, weight loss. Denies family history of colon cancer.  Past Medical History:  Diagnosis Date   Allergy    Anemia    as achild   Anxiety    Arthritis    "all over"   Cataract    bilateral - MD just watching   Depression    Hyperlipidemia    Hypertension    Type 2 diabetes mellitus (HCC)     Past Surgical History:  Procedure Laterality Date   [place]     greater [place]   SHOULDER SURGERY Left 1986   TUBAL LIGATION     UPPER GASTROINTESTINAL ENDOSCOPY      Prior to Admission medications   Medication Sig Start Date End Date Taking? Authorizing Provider  atorvastatin (LIPITOR) 10 MG tablet TAKE 1 TABLET(10 MG) BY MOUTH DAILY 10/14/20  Yes Towanda Octave, MD  citalopram (CELEXA) 20 MG tablet TAKE 1 TABLET(20 MG) BY MOUTH DAILY 06/15/21  Yes Towanda Octave, MD  losartan (COZAAR) 50 MG tablet TAKE 1 TABLET(50 MG) BY MOUTH DAILY 01/12/21  Yes Autry-Lott, Randa Evens, DO  acetaminophen (TYLENOL) 500 MG tablet Take 500 mg by mouth every 6 (six) hours as needed for mild pain, moderate pain or headache.    [provider]  diclofenac Sodium (VOLTAREN) 1 % GEL Apply 2 g topically 4 (four) times daily. Patient taking differently: Apply 2 g topically 4 (four) times daily as needed (pain). 02/19/21   Towanda Octave, MD   hydrocortisone (ANUSOL-HC) 2.5 % rectal cream Place 1 application rectally 2 (two) times daily. Patient not taking: Reported on 07/21/2021 05/06/20   Towanda Octave, MD  ketorolac (ACULAR) 0.5 % ophthalmic solution Place 1 drop into the left eye in the morning, at noon, and at bedtime. 06/29/21   [provider]  meclizine (ANTIVERT) 25 MG tablet Take 1 tablet (25 mg total) by mouth 3 (three) times daily as needed for dizziness. 07/24/21   Zadie Rhine, MD  naproxen (NAPROSYN) 500 MG tablet TAKE 1 TABLET(500 MG) BY MOUTH TWICE DAILY WITH A MEAL Patient taking differently: Take 500 mg by mouth daily as needed for moderate pain. 07/07/21   Towanda Octave, MD  polyethylene glycol powder (GLYCOLAX/MIRALAX) 17 GM/SCOOP powder Take 17 g by mouth 2 (two) times daily as needed. Patient not taking: Reported on 07/21/2021 05/06/20   Towanda Octave, MD  prednisoLONE acetate (PRED FORTE) 1 % ophthalmic suspension Place 1 drop into the right eye in the morning, at noon, and at bedtime. 06/28/21   [provider]    Current Outpatient Medications  Medication Sig Dispense Refill   atorvastatin (LIPITOR) 10 MG tablet TAKE 1 TABLET(10 MG) BY MOUTH DAILY 90 tablet 3   citalopram (CELEXA) 20 MG tablet TAKE 1 TABLET(20 MG) BY MOUTH DAILY 90 tablet 0   losartan (COZAAR) 50 MG tablet TAKE 1 TABLET(50  MG) BY MOUTH DAILY 90 tablet 3   acetaminophen (TYLENOL) 500 MG tablet Take 500 mg by mouth every 6 (six) hours as needed for mild pain, moderate pain or headache.     diclofenac Sodium (VOLTAREN) 1 % GEL Apply 2 g topically 4 (four) times daily. (Patient taking differently: Apply 2 g topically 4 (four) times daily as needed (pain).) 350 g 0   hydrocortisone (ANUSOL-HC) 2.5 % rectal cream Place 1 application rectally 2 (two) times daily. (Patient not taking: Reported on 07/21/2021) 30 g 0   ketorolac (ACULAR) 0.5 % ophthalmic solution Place 1 drop into the left eye in the morning, at noon, and at bedtime.      meclizine (ANTIVERT) 25 MG tablet Take 1 tablet (25 mg total) by mouth 3 (three) times daily as needed for dizziness. 15 tablet 0   naproxen (NAPROSYN) 500 MG tablet TAKE 1 TABLET(500 MG) BY MOUTH TWICE DAILY WITH A MEAL (Patient taking differently: Take 500 mg by mouth daily as needed for moderate pain.) 20 tablet 0   polyethylene glycol powder (GLYCOLAX/MIRALAX) 17 GM/SCOOP powder Take 17 g by mouth 2 (two) times daily as needed. (Patient not taking: Reported on 07/21/2021) 850 g 1   prednisoLONE acetate (PRED FORTE) 1 % ophthalmic suspension Place 1 drop into the right eye in the morning, at noon, and at bedtime.     Current Facility-Administered Medications  Medication Dose Route Frequency Provider Last Rate Last Admin   0.9 %  sodium chloride infusion  500 mL Intravenous Once Imogene Burn, MD        Allergies as of 08/18/2021 - Review Complete 08/18/2021  Allergen Reaction Noted   Lisinopril Cough 08/25/2018    Family History  Problem Relation Age of Onset   Hypertension Mother    Alzheimer's disease Father    Diabetes Father    Colon cancer Neg Hx    Rectal cancer Neg Hx    Stomach cancer Neg Hx    Colon polyps Neg Hx    Crohn's disease Neg Hx    Esophageal cancer Neg Hx     Social History   Socioeconomic History   Marital status: Single    Spouse name: Not on file   Number of children: Not on file   Years of education: Not on file   Highest education level: Not on file  Occupational History   Not on file  Tobacco Use   Smoking status: Never    Passive exposure: Past   Smokeless tobacco: Never  Vaping Use   Vaping Use: Never used  Substance and Sexual Activity   Alcohol use: Never   Drug use: Never   Sexual activity: Not on file  Other Topics Concern   Not on file  Social History Narrative   Not on file   Social Determinants of Health   Financial Resource Strain: Not on file  Food Insecurity: Not on file  Transportation Needs: Not on file  Physical  Activity: Not on file  Stress: Not on file  Social Connections: Not on file  Intimate Partner Violence: Not on file    Physical Exam: Vital signs in last 24 hours: BP (!) 148/78   Pulse 65   Temp (!) 97.5 F (36.4 C)   Ht 5\' 1"  (1.549 m)   Wt 146 lb 3.2 oz (66.3 kg)   LMP  (LMP Unknown)   SpO2 97%   BMI 27.62 kg/m  GEN: NAD EYE: Sclerae anicteric ENT: MMM CV: Non-tachycardic  Pulm: No increased work of breathing GI: Soft, NT/ND NEURO:  Alert & Oriented   [name], MD [facility]  08/18/2021 11:42 AM

## 2021-08-18 NOTE — Progress Notes (Signed)
Pt's states no medical or surgical changes since previsit or office visit. 

## 2021-08-18 NOTE — Patient Instructions (Signed)
Handouts provided on diverticulosis and hemorrhoids.   Repeat colonoscopy in 10 years for screening purposes.   YOU HAD AN ENDOSCOPIC PROCEDURE TODAY AT THE [facility]:   Refer to the procedure report that was given to you for any specific questions about what was found during the examination.  If the procedure report does not answer your questions, please call your gastroenterologist to clarify.  If you requested that your care partner not be given the details of your procedure findings, then the procedure report has been included in a sealed envelope for you to review at your convenience later.  YOU SHOULD EXPECT: Some feelings of bloating in the abdomen. Passage of more gas than usual.  Walking can help get rid of the air that was put into your GI tract during the procedure and reduce the bloating. If you had a lower endoscopy (such as a colonoscopy or flexible sigmoidoscopy) you may notice spotting of blood in your stool or on the toilet paper. If you underwent a bowel prep for your procedure, you may not have a normal bowel movement for a few days.  Please Note:  You might notice some irritation and congestion in your nose or some drainage.  This is from the oxygen used during your procedure.  There is no need for concern and it should clear up in a day or so.  SYMPTOMS TO REPORT IMMEDIATELY:  Following lower endoscopy (colonoscopy or flexible sigmoidoscopy):  Excessive amounts of blood in the stool  Significant tenderness or worsening of abdominal pains  Swelling of the abdomen that is new, acute  Fever of 100F or higher  For urgent or emergent issues, a gastroenterologist can be reached at any hour by calling [phone]. Do not use MyChart messaging for urgent concerns.    DIET:  We do recommend a small meal at first, but then you may proceed to your regular diet.  Drink plenty of fluids but you should avoid alcoholic beverages for 24 hours.  ACTIVITY:  You should  plan to take it easy for the rest of today and you should NOT DRIVE or use heavy machinery until tomorrow (because of the sedation medicines used during the test).    FOLLOW UP: Our staff will call the number listed on your records the next business day following your procedure.  We will call around 7:15- 8:00 am to check on you and address any questions or concerns that you may have regarding the information given to you following your procedure. If we do not reach you, we will leave a message.  If you develop any symptoms (ie: fever, flu-like symptoms, shortness of breath, cough etc.) before then, please call [phone].  If you test positive for Covid 19 in the 2 weeks post procedure, please call and report this information to us.    If any biopsies were taken you will be contacted by phone or by letter within the next 1-3 weeks.  Please call us at [phone] if you have not heard about the biopsies in 3 weeks.    SIGNATURES/CONFIDENTIALITY: You and/or your care partner have signed paperwork which will be entered into your electronic medical record.  These signatures attest to the fact that that the information above on your After Visit Summary has been reviewed and is understood.  Full responsibility of the confidentiality of this discharge information lies with you and/or your care-partner.  

## 2021-08-19 ENCOUNTER — Telehealth: Payer: Self-pay | Admitting: *Deleted

## 2021-08-19 NOTE — Telephone Encounter (Signed)
  Follow up Call-     08/18/2021   11:15 AM  Call back number  Post procedure Call Back phone  # [phone]  Permission to leave phone message Yes     Patient questions:  Do you have a fever, pain , or abdominal swelling? No. Pain Score  0 *  Have you tolerated food without any problems? Yes.    Have you been able to return to your normal activities? Yes.    Do you have any questions about your discharge instructions: Diet   No. Medications  No. Follow up visit  No.  Do you have questions or concerns about your Care? No.  Actions: * If pain score is 4 or above: No action needed, pain <4.

## 2021-08-20 NOTE — Progress Notes (Deleted)
  SUBJECTIVE:   CHIEF COMPLAINT / HPI:   Hypertension:   today. Home medications include: Losartan 50 mg daily. She {Blank single:19197::"endorses","does not endorse"} taking these medications as prescribed. {Blank single:19197::"Does","Does not"} check blood pressure at home.*** Denies any SOB, CP, vision changes, LE edema, medication SEs, or symptoms of hypotension. Diet ***. Exercise ***. Most recent creatinine trend:  Lab Results  Component Value Date   CREATININE 0.87 07/23/2021   CREATININE 0.86 06/10/2021   CREATININE 0.82 04/02/2020   Patient  had a BMP in the past 1 year. has  PERTINENT  PMH / PSH: HTN, HLD, prediabetes  OBJECTIVE:  LMP  (LMP Unknown)   General: NAD, pleasant, able to participate in exam Cardiac: RRR, no murmurs auscultated Respiratory: CTAB, normal WOB Abdomen: soft, non-tender, non-distended, normoactive bowel sounds Extremities: warm and well perfused, no edema or cyanosis Skin: warm and dry, no rashes noted Neuro: alert, no obvious focal deficits, speech normal Psych: Normal affect and mood  ASSESSMENT/PLAN:  No problem-specific Assessment & Plan notes found for this encounter.   No orders of the defined types were placed in this encounter.  No orders of the defined types were placed in this encounter.  No follow-ups on file. [name], DO 08/20/2021, 12:55 PM PGY-***, [facility] {    This will disappear when note is signed, click to select method of visit    :1}

## 2021-08-21 ENCOUNTER — Ambulatory Visit (INDEPENDENT_AMBULATORY_CARE_PROVIDER_SITE_OTHER): Payer: No Typology Code available for payment source | Admitting: Student

## 2021-08-21 ENCOUNTER — Encounter: Payer: Self-pay | Admitting: Student

## 2021-08-21 VITALS — BP 147/80 | HR 65 | Ht 61.0 in | Wt 147.0 lb

## 2021-08-21 DIAGNOSIS — I10 Essential (primary) hypertension: Secondary | ICD-10-CM | POA: Diagnosis not present

## 2021-08-21 MED ORDER — AMLODIPINE BESYLATE 5 MG PO TABS: 5.0000 mg | ORAL_TABLET | Freq: Every day | ORAL | 3 refills | Status: DC

## 2021-08-21 NOTE — Progress Notes (Addendum)
    SUBJECTIVE:   CHIEF COMPLAINT / HPI:   Stacy Nguyen is a 66 y.o. female who presents today for follow-up of hypertension.  Hypertension: BP: (!) 147/80 today. Home medications include: losartan 50 mg daily. She endorses taking these medications as prescribed. Does check blood pressure at home. Her home log shows readings primarily in the 130s-140s/high 70s-low 80s, with lowest reading of 119/75 and highest of 150/81.  Denies any SOB, CP, vision changes, eye pressure, migraines, LE edema, medication SEs, or symptoms of hypotension.  Diet: trying to eat a balanced diet, trying to limit salt; no dairy (lactose intolerant). 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 Most recent creatinine trend:  Lab Results  Component Value Date   CREATININE 0.87 07/23/2021   CREATININE 0.86 06/10/2021   CREATININE 0.82 04/02/2020   Patient has had a BMP in the past 1 year.  PERTINENT  PMH / PSH: HTN, HLD, prediabetes  OBJECTIVE:  BP (!) 147/80   Pulse 65   Ht 5\' 1"  (1.549 m)   Wt 147 lb (66.7 kg)   LMP  (LMP Unknown)   SpO2 100%   BMI 27.78 kg/m   General: Pt is well-appearing and seated comfortably; no acute distress. Cardiovascular: RRR, no murmurs, rubs, gallops. Pulmonary: Normal work of breathing. Lungs clear to auscultation bilaterally. Neuro/Psych: A&Ox3. Normal affect.  ASSESSMENT/PLAN:  HTN (hypertension) BP today is 147/80; pt reports similarly elevated home readings. She is compliant with Losartan 50 mg daily. - Recommend starting amlodipine 5 mg daily in addition to Losartan to improve BP control - Pt will continue monitoring BP at home and follow-up in 1 month to reassess.  , Medical Student [facility]   I was personally present and performed or re-performed the history, physical exam and medical decision making activities of this service and have verified that the service and findings are  accurately documented in the student's note.  [facility], DO                  08/21/2021, 12:21 PM

## 2021-08-21 NOTE — Assessment & Plan Note (Signed)
BP today is 147/80; pt reports similarly elevated home readings. She is compliant with Losartan 50 mg daily. - Recommend starting amlodipine 5 mg daily in addition to Losartan to improve BP control - Pt will continue monitoring BP at home and follow-up in 1 month to reassess.

## 2021-08-21 NOTE — Patient Instructions (Signed)
It was great to see you today! Thank you for choosing [facility] for your primary care. Stacy Nguyen was seen for hypertension.  Today we addressed: Since you are having elevated blood pressure, we are introducing another medication called amlodipine.  The more common side effect to keep an eye on is leg swelling; if you are experiencing this please let us know.  You can take this medication at the exact same time that you are taking your losartan.  I have attached another blood pressure log below with information of how to check your blood pressure should you need it.  If you haven't already, sign up for My Chart to have easy access to your labs results, and communication with your primary care physician.  You should return to our clinic Return in about 1 month (around 09/21/2021) for Hypertension follow-up.  Please arrive 15 minutes before your appointment to ensure smooth check in process.  We appreciate your efforts in making this happen.  Please call the clinic at [phone] if your symptoms worsen or you have any concerns.  Thank you for allowing me to participate in your care, [name], DO 08/21/2021, 12:09 PM PGY-2, [facility]   Blood Pressure Record Sheet To take your blood pressure, you will need a blood pressure machine. You can buy a blood pressure machine (blood pressure monitor) at your clinic, drug store, or online. When choosing one, consider: An automatic monitor that has an arm cuff. A cuff that wraps snugly around your upper arm. You should be able to fit only one finger between your arm and the cuff. A device that stores blood pressure reading results. Do not choose a monitor that measures your blood pressure from your wrist or finger. Follow your health care provider's instructions for how to take your blood pressure. To use this form: Take your blood pressure medications every day These measurements should be taken when you have been  at rest for at least 10-15 min Take at least 2 readings with each blood pressure check. This makes sure the results are correct. Wait 1-2 minutes between measurements. Write down the results in the spaces on this form. Keep in mind it should always be recorded systolic over diastolic. Both numbers are important.  Repeat this every day for 2-3 weeks, or as told by your health care provider.  Make a follow-up appointment with your health care provider to discuss the results.  Blood Pressure Log Date Medications taken? (Y/N) Blood Pressure Time of Day

## 2021-08-24 ENCOUNTER — Other Ambulatory Visit: Payer: Self-pay | Admitting: Family Medicine

## 2021-09-17 NOTE — Progress Notes (Signed)
  SUBJECTIVE:   CHIEF COMPLAINT / HPI:   Hypertension: BP: 108/62 today. Home medications include: Amlodipine 5 mg daily, losartan 50 mg daily. She endorses taking these medications as prescribed. Does check blood pressure at home.  Patient has had a BMP in the past 1 year.  PERTINENT  PMH / PSH: HTN, HLD, prediabetes  OBJECTIVE:  BP 108/62   Pulse 65   Wt 146 lb (66.2 kg)   LMP  (LMP Unknown)   SpO2 95%   BMI 27.59 kg/m   General: NAD, pleasant, able to participate in exam Cardiac: RRR, no murmurs auscultated Respiratory: CTAB, normal WOB Psych: Normal affect and mood  ASSESSMENT/PLAN:  HTN (hypertension) BP: 108/62 today. Well controlled. Goal of <130/80.  Blood pressure log primarily shows 110s over 70s. Continue to work on healthy dietary habits and exercise. Follow up in 4 to 5 months.  Medication regimen: Amlodipine 5 mg daily, losartan 50 mg daily   Return in about 5 months (around 02/18/2022) for Annual wellness visit. [name], DO 09/18/2021, 3:35 PM PGY-2, [facility]

## 2021-09-18 ENCOUNTER — Encounter: Payer: Self-pay | Admitting: Student

## 2021-09-18 ENCOUNTER — Ambulatory Visit (INDEPENDENT_AMBULATORY_CARE_PROVIDER_SITE_OTHER): Payer: No Typology Code available for payment source | Admitting: Student

## 2021-09-18 VITALS — BP 108/62 | HR 65 | Wt 146.0 lb

## 2021-09-18 DIAGNOSIS — I10 Essential (primary) hypertension: Secondary | ICD-10-CM | POA: Diagnosis not present

## 2021-09-18 NOTE — Patient Instructions (Signed)
It was great to see you today! Thank you for choosing [facility] for your primary care. Stacy Nguyen was seen for hypertension follow-up.  Today we addressed: Hypertension: Please continue your medications.  You are doing a great job with this and your blood pressure looks excellent.  I can see you back in 4 to 5 months for your annual wellness visit.  If you haven't already, sign up for My Chart to have easy access to your labs results, and communication with your primary care physician.  You should return to our clinic Return in about 5 months (around 02/18/2022).  I recommend that you always bring your medications to each appointment as this makes it easy to ensure you are on the correct medications and helps [place] not miss refills when you need them.  Please arrive 15 minutes before your appointment to ensure smooth check in process.  We appreciate your efforts in making this happen.  Please call the clinic at [phone] if your symptoms worsen or you have any concerns.  Thank you for allowing me to participate in your care, Stacy Mattocks, DO 09/18/2021, 3:27 PM PGY-2, [facility]

## 2021-09-18 NOTE — Assessment & Plan Note (Addendum)
BP: 108/62 today. Well controlled. Goal of <130/80.  Blood pressure log primarily shows 110s over 70s. Continue to work on healthy dietary habits and exercise. Follow up in 4 to 5 months.  Medication regimen: Amlodipine 5 mg daily, losartan 50 mg daily

## 2021-09-23 ENCOUNTER — Other Ambulatory Visit: Payer: Self-pay | Admitting: *Deleted

## 2021-09-23 DIAGNOSIS — M25551 Pain in right hip: Secondary | ICD-10-CM

## 2021-09-28 NOTE — Telephone Encounter (Signed)
Patient calls nurse line regarding prescription refills. Patient states that she has been taking Celexa "for years" and naproxen as needed for arthritis.   Advised that refill was denied due to needing an appointment. Patient states that she "was just seen at the beginning of the month". She is asking if she needs to be seen again in order to receive these refills.   Please advise.   Veronda Prude, RN

## 2021-09-29 ENCOUNTER — Other Ambulatory Visit: Payer: Self-pay | Admitting: Student

## 2021-09-29 DIAGNOSIS — M25551 Pain in right hip: Secondary | ICD-10-CM

## 2021-09-29 MED ORDER — NAPROXEN 500 MG PO TABS: 500.0000 mg | ORAL_TABLET | Freq: Every day | ORAL | 0 refills | Status: DC | PRN

## 2021-09-29 MED ORDER — CITALOPRAM HYDROBROMIDE 20 MG PO TABS
ORAL_TABLET | ORAL | 0 refills | Status: DC
Start: 2021-09-29 — End: 2021-12-30

## 2021-10-01 NOTE — Progress Notes (Signed)
    SUBJECTIVE:   CHIEF COMPLAINT / HPI:   66 year old female presents with complain of abdominal pain that started about 4 weeks ago. Describes pain as non-radiating dull nagging pain. Pain is mostly constant but it varies day to day It is worse after eating Nothing has improved the pain, tried Over-the counter Simethicone Had x1 NBNB emesis. No blood in stool or blackened stool. Patient endorses extended use of Naproxen daily for 3 months for arthritis pain in her knees and back Denies any palpitation. Recently had her colonoscopy about 4-6 weeks ago.      PERTINENT  PMH / PSH: HLD, HTN, Prediabetes  OBJECTIVE:   BP 125/70   Pulse 66   Wt 143 lb 6.4 oz (65 kg)   LMP  (LMP Unknown)   SpO2 98%   BMI 27.10 kg/m    Physical Exam General: Alert, well appearing, NAD Cardiovascular: RRR, No Murmurs, Normal S2/S2 Respiratory: CTAB, No wheezing or Rales Abdomen: +BS, No distension, mild tenderness localized in the epigastric area Extremities: No edema on extremities    ASSESSMENT/PLAN:   Epigastric pain Broad differential for epigastric pain includes dyspepsia, gastritis, acid reflux, GERD, pancreatitis and ACS. Less likely ACS due to nature of pain, pain is nonexertional and reproducible on exam.  Given worsening of symptom with eating and prolonged history of naproxen use symptom is highly suspicious of gastric ulcer or dyspepsia. -Rx Omeprazole 20 mg daily -Advised patient to discontinue naproxen same -Obtain CBC -Follow-up in 2-3 weeks to assess symptom relieve    Jerre Simon, MD [facility]

## 2021-10-02 ENCOUNTER — Ambulatory Visit (INDEPENDENT_AMBULATORY_CARE_PROVIDER_SITE_OTHER): Payer: No Typology Code available for payment source | Admitting: Student

## 2021-10-02 ENCOUNTER — Other Ambulatory Visit: Payer: Self-pay | Admitting: Student

## 2021-10-02 VITALS — BP 125/70 | HR 66 | Ht 61.0 in | Wt 143.4 lb

## 2021-10-02 DIAGNOSIS — R1013 Epigastric pain: Secondary | ICD-10-CM | POA: Diagnosis not present

## 2021-10-02 MED ORDER — OMEPRAZOLE 20 MG PO CPDR: 20.0000 mg | DELAYED_RELEASE_CAPSULE | Freq: Every day | ORAL | 0 refills | Status: DC

## 2021-10-02 NOTE — Patient Instructions (Addendum)
It was wonderful to meet you today. Thank you for allowing me to be a part of your care. Below is a short summary of what we discussed at your visit today:  Your abdominal pain is possibly due to irritation or ulcer in your stomach lining  Take omeprazole 20 mg daily. Usually 30 minutes before meal.  Discontinue use of naproxen for your arthritis.  Continue use of Tylenol. I have placed order for over-the-counter Voltaren gel apply and affected site for your arthritis  We will check your blood counts today.  Follow-up in 2 to 3 weeks to assess for symptom relieve  Please bring all of your medications to every appointment!  If you have any questions or concerns, please do not hesitate to contact us via phone or MyChart message.   Jerre Simon, MD [facility]

## 2021-10-03 LAB — CBC WITH DIFFERENTIAL/PLATELET
Basophils Absolute: 0 10*3/uL (ref 0.0–0.2)
Basos: 1 %
EOS (ABSOLUTE): 0 10*3/uL (ref 0.0–0.4)
Eos: 1 %
Hematocrit: 37.9 % (ref 34.0–46.6)
Hemoglobin: 12.3 g/dL (ref 11.1–15.9)
Immature Grans (Abs): 0 10*3/uL (ref 0.0–0.1)
Immature Granulocytes: 0 %
Lymphocytes Absolute: 1.3 10*3/uL (ref 0.7–3.1)
Lymphs: 43 %
MCH: 27.5 pg (ref 26.6–33.0)
MCHC: 32.5 g/dL (ref 31.5–35.7)
MCV: 85 fL (ref 79–97)
Monocytes Absolute: 0.2 10*3/uL (ref 0.1–0.9)
Monocytes: 7 %
Neutrophils Absolute: 1.4 10*3/uL (ref 1.4–7.0)
Neutrophils: 48 %
Platelets: 171 10*3/uL (ref 150–450)
RBC: 4.47 x10E6/uL (ref 3.77–5.28)
RDW: 13.2 % (ref 11.7–15.4)
WBC: 3 10*3/uL — ABNORMAL LOW (ref 3.4–10.8)

## 2021-10-06 DIAGNOSIS — H35033 Hypertensive retinopathy, bilateral: Secondary | ICD-10-CM | POA: Diagnosis not present

## 2021-10-06 DIAGNOSIS — Z961 Presence of intraocular lens: Secondary | ICD-10-CM | POA: Diagnosis not present

## 2021-10-06 DIAGNOSIS — H43813 Vitreous degeneration, bilateral: Secondary | ICD-10-CM | POA: Diagnosis not present

## 2021-10-06 DIAGNOSIS — H04123 Dry eye syndrome of bilateral lacrimal glands: Secondary | ICD-10-CM | POA: Diagnosis not present

## 2021-10-09 ENCOUNTER — Other Ambulatory Visit: Payer: Self-pay | Admitting: *Deleted

## 2021-10-09 DIAGNOSIS — E785 Hyperlipidemia, unspecified: Secondary | ICD-10-CM

## 2021-10-12 MED ORDER — ATORVASTATIN CALCIUM 10 MG PO TABS: ORAL_TABLET | ORAL | 3 refills | Status: DC

## 2021-10-19 NOTE — Progress Notes (Unsigned)
  SUBJECTIVE:   CHIEF COMPLAINT / HPI:   Epigastric pain: Has been compliant with omeprazole 20 mg daily and discontinued naproxen.  CBC unremarkable.  PERTINENT  PMH / PSH: HTN, HLD, prediabetes  OBJECTIVE:  LMP  (LMP Unknown)   General: NAD, pleasant, able to participate in exam Cardiac: RRR, no murmurs auscultated Respiratory: CTAB, normal WOB Abdomen: soft, non-tender, non-distended, normoactive bowel sounds Extremities: warm and well perfused, no edema or cyanosis Skin: warm and dry, no rashes noted Neuro: alert, no obvious focal deficits, speech normal Psych: Normal affect and mood  ASSESSMENT/PLAN:  No problem-specific Assessment & Plan notes found for this encounter.   No orders of the defined types were placed in this encounter.  No orders of the defined types were placed in this encounter.  No follow-ups on file. [name], DO 10/19/2021, 1:41 PM PGY-***, [facility] {    This will disappear when note is signed, click to select method of visit    :1}

## 2021-10-20 ENCOUNTER — Encounter: Payer: Self-pay | Admitting: Student

## 2021-10-20 ENCOUNTER — Ambulatory Visit (INDEPENDENT_AMBULATORY_CARE_PROVIDER_SITE_OTHER): Payer: No Typology Code available for payment source | Admitting: Student

## 2021-10-20 VITALS — BP 112/76 | HR 65 | Ht 61.0 in | Wt 149.6 lb

## 2021-10-20 DIAGNOSIS — R1013 Epigastric pain: Secondary | ICD-10-CM | POA: Diagnosis not present

## 2021-10-20 NOTE — Assessment & Plan Note (Signed)
Suspect this was related to naproxen use.  Resolved with discontinuation and taking omeprazole.  Plan to finish omeprazole.  Monitor if symptoms return and if they do will prescribe extended dosage.  Should that not work, likely consider referral to GI for EGD and evaluation for Barrett's esophagus and chronic gastric ulcers.

## 2021-10-20 NOTE — Patient Instructions (Addendum)
It was great to see you today! Thank you for choosing [facility] for your primary care. Stacy Nguyen was seen for epigastric pain follow-up.  Today we addressed: Epigastric pain: I am glad this has resolved.  Please continue to finish your 1 month supply of omeprazole.  If your symptoms come back, we may try an extended time period of this medication.  If you haven't already, sign up for My Chart to have easy access to your labs results, and communication with your primary care physician.  Call the clinic at [phone] if your symptoms worsen or you have any concerns.  You should return to our clinic Return if symptoms worsen or fail to improve. Please arrive 15 minutes before your appointment to ensure smooth check in process.  We appreciate your efforts in making this happen.  Thank you for allowing me to participate in your care, [name], DO 10/20/2021, 10:10 AM PGY-2, [facility]

## 2021-10-30 ENCOUNTER — Other Ambulatory Visit: Payer: Self-pay | Admitting: Student

## 2021-10-30 DIAGNOSIS — R1013 Epigastric pain: Secondary | ICD-10-CM

## 2021-12-24 DIAGNOSIS — N182 Chronic kidney disease, stage 2 (mild): Secondary | ICD-10-CM | POA: Diagnosis not present

## 2021-12-24 DIAGNOSIS — E1169 Type 2 diabetes mellitus with other specified complication: Secondary | ICD-10-CM | POA: Diagnosis not present

## 2021-12-24 DIAGNOSIS — F324 Major depressive disorder, single episode, in partial remission: Secondary | ICD-10-CM | POA: Diagnosis not present

## 2021-12-24 DIAGNOSIS — E785 Hyperlipidemia, unspecified: Secondary | ICD-10-CM | POA: Diagnosis not present

## 2021-12-24 DIAGNOSIS — E1122 Type 2 diabetes mellitus with diabetic chronic kidney disease: Secondary | ICD-10-CM | POA: Diagnosis not present

## 2021-12-24 DIAGNOSIS — E663 Overweight: Secondary | ICD-10-CM | POA: Diagnosis not present

## 2021-12-24 DIAGNOSIS — I129 Hypertensive chronic kidney disease with stage 1 through stage 4 chronic kidney disease, or unspecified chronic kidney disease: Secondary | ICD-10-CM | POA: Diagnosis not present

## 2021-12-24 DIAGNOSIS — Z6829 Body mass index (BMI) 29.0-29.9, adult: Secondary | ICD-10-CM | POA: Diagnosis not present

## 2021-12-24 DIAGNOSIS — Z008 Encounter for other general examination: Secondary | ICD-10-CM | POA: Diagnosis not present

## 2021-12-30 ENCOUNTER — Other Ambulatory Visit: Payer: Self-pay | Admitting: Student

## 2022-01-05 ENCOUNTER — Ambulatory Visit (INDEPENDENT_AMBULATORY_CARE_PROVIDER_SITE_OTHER): Payer: No Typology Code available for payment source | Admitting: Family Medicine

## 2022-01-05 ENCOUNTER — Encounter: Payer: Self-pay | Admitting: Family Medicine

## 2022-01-05 VITALS — BP 118/72 | HR 80 | Temp 98.5°F | Wt 148.0 lb

## 2022-01-05 DIAGNOSIS — J029 Acute pharyngitis, unspecified: Secondary | ICD-10-CM

## 2022-01-05 NOTE — Progress Notes (Signed)
  SUBJECTIVE:   CHIEF COMPLAINT / HPI:   Cough, sore throat x5 days -Visited family over thanksgiving, everyone is now sick. Granddaughter was sick first -Temp 99.7 at home, nothing above 100F, afebrile here -Some earache and headaches, sinus pressure. Endorses postnasal drip. Denies SOB, diarrhea, vomiting -cough productive of clear sputum, has noticed a little blood -No smoking history, no fhx of lung disease or cancer -Has been using OTC cold medicine    OBJECTIVE:  BP 118/72   Pulse 80   Temp 98.5 F (36.9 C) (Oral)   Wt 148 lb (67.1 kg)   LMP  (LMP Unknown)   SpO2 98%   BMI 27.96 kg/m   General: NAD, pleasant, able to participate in exam HEENT: b/l TM visualized, L TM with some mild erythema but no perforation or bulging. R TM wnl. No significant pharyngeal erythema noted, no tonsilar exudate Cardiac: RRR, no murmurs auscultated Respiratory: CTAB, normal WOB   ASSESSMENT/PLAN:   Sore throat Assessment & Plan: Suspect viral URI given 5 day duration and lack of significant fevers or other serious symptoms. Recommend continued OTC remedies and decongestion to aid with earaches.No respiratory distress. Will check for flu/covid/rsv in case she needs to quarantine. f/u as needed, return precautions provided  Orders: -     COVID-19, Flu A+B and RSV    Return if symptoms worsen or fail to improve.  Vonna Drafts, MD [facility]

## 2022-01-05 NOTE — Patient Instructions (Signed)
It was wonderful to see you today.  Please bring ALL of your medications with you to every visit.   Updates from today's visit:  Please continue over the counter remedies for your cold  Please give [place] a call if your symptoms do not improve after another 7 days and you have fevers above 100.25F, shortness of breath, or chest pain  I will let you know about your test for flu, covid, and RSV  Please follow up as needed   Thank you for choosing [facility].   Please call [phone] with any questions about today's appointment.  Please be sure to schedule follow up at the front  desk before you leave today.   Vonna Drafts, MD  Family Medicine

## 2022-01-05 NOTE — Assessment & Plan Note (Addendum)
Suspect viral URI given 5 day duration and lack of significant fevers or other serious symptoms. Recommend continued OTC remedies and decongestion to aid with earaches.No respiratory distress. Will check for flu/covid/rsv in case she needs to quarantine. f/u as needed, return precautions provided

## 2022-01-07 LAB — COVID-19, FLU A+B AND RSV
Influenza A, NAA: NOT DETECTED
Influenza B, NAA: NOT DETECTED
RSV, NAA: NOT DETECTED
SARS-CoV-2, NAA: NOT DETECTED

## 2022-01-09 ENCOUNTER — Encounter (HOSPITAL_COMMUNITY): Payer: Self-pay

## 2022-01-09 ENCOUNTER — Ambulatory Visit (INDEPENDENT_AMBULATORY_CARE_PROVIDER_SITE_OTHER): Payer: No Typology Code available for payment source

## 2022-01-09 ENCOUNTER — Ambulatory Visit (HOSPITAL_COMMUNITY)
Admission: EM | Admit: 2022-01-09 | Discharge: 2022-01-09 | Disposition: A | Discharge status: Home / Self Care | Payer: No Typology Code available for payment source | Attending: Family Medicine | Admitting: Family Medicine

## 2022-01-09 DIAGNOSIS — R059 Cough, unspecified: Secondary | ICD-10-CM | POA: Diagnosis not present

## 2022-01-09 DIAGNOSIS — R6883 Chills (without fever): Secondary | ICD-10-CM | POA: Diagnosis not present

## 2022-01-09 DIAGNOSIS — J0100 Acute maxillary sinusitis, unspecified: Secondary | ICD-10-CM | POA: Diagnosis not present

## 2022-01-09 DIAGNOSIS — R0602 Shortness of breath: Secondary | ICD-10-CM | POA: Diagnosis not present

## 2022-01-09 DIAGNOSIS — I10 Essential (primary) hypertension: Secondary | ICD-10-CM | POA: Diagnosis not present

## 2022-01-09 DIAGNOSIS — R051 Acute cough: Secondary | ICD-10-CM

## 2022-01-09 MED ORDER — AMOXICILLIN 875 MG PO TABS: 875.0000 mg | ORAL_TABLET | Freq: Two times a day (BID) | ORAL | 0 refills | Status: AC

## 2022-01-09 MED ORDER — BENZONATATE 100 MG PO CAPS: ORAL_CAPSULE | ORAL | 0 refills | Status: DC

## 2022-01-09 NOTE — ED Triage Notes (Signed)
Pt is here for pharyngitis, cough body aches, chills , sinus pressure  nose bleed fever mostly at night  x 1wk

## 2022-01-09 NOTE — Discharge Instructions (Addendum)
Your chest x-ray did not show any pneumonia.  Your blood pressure was noted to be elevated during your visit today. If you are currently taking medication for high blood pressure, please ensure you are taking this as directed. If you do not have a history of high blood pressure and your blood pressure remains persistently elevated, you may need to begin taking a medication at some point. You may return here within the next few days to recheck if unable to see your primary care provider or if you do not have a one.  BP (!) 146/121 (BP Location: Left Arm)   Pulse 82   Temp 99.5 F (37.5 C) (Oral)   Resp 18   LMP  (LMP Unknown)   SpO2 99%   BP Readings from Last 3 Encounters:  01/09/22 (!) 146/121  01/05/22 118/72  10/20/21 112/76   Avoid medicines with Sudafed in them, a common decongestant.

## 2022-01-11 NOTE — ED Provider Notes (Signed)
[place]   [id number] 01/09/22 Arrival Time: 1522  ASSESSMENT & PLAN:  1. Acute cough   2. Acute non-recurrent maxillary sinusitis   3. Elevated blood pressure reading in office with diagnosis of hypertension    I have personally viewed the imaging studies ordered this visit. No acute changes on CXR.  Viral testing declined.. OTC symptom care as needed. Will treat for sinusitis; post-viral. Lingering cough. No s/s of HTN urgency. Will check pressure at home.  Discharge Medication List as of 01/09/2022  6:13 PM     START taking these medications   Details  amoxicillin (AMOXIL) 875 MG tablet Take 1 tablet (875 mg total) by mouth 2 (two) times daily for 10 days., Starting Sat 01/09/2022, Until Tue 01/19/2022, Normal    benzonatate (TESSALON) 100 MG capsule Take 1 capsule by mouth every 8 (eight) hours for cough., Normal         Follow-up Information     Wells Guiles, DO.   Specialty: Family Medicine Why: If worsening or failing to improve as anticipated. Contact information: [place] [id number] [phone]                 Reviewed expectations re: course of current medical issues. Questions answered. Outlined signs and symptoms indicating need for more acute intervention. Understanding verbalized. After Visit Summary given.   SUBJECTIVE: History from: Patient. Stacy Nguyen is a 66 y.o. female. Reports: Pt is here for pharyngitis, cough body aches, chills , sinus pressure  nose bleed fever mostly at night; x > 1 weeks. Over past 2 days has noted much frontal sinus pressure/pain. . Denies: difficulty breathing. Normal PO intake without n/v/d.  Increased blood pressure noted today. Reports that she is treated for HTN. She reports no chest pain on exertion, no dyspnea on exertion, no swelling of ankles, no orthostatic dizziness or lightheadedness, no orthopnea or paroxysmal nocturnal dyspnea, and no  palpitations.  OBJECTIVE:  Vitals:   01/09/22 1710  BP: (!) 146/121  Pulse: 82  Resp: 18  Temp: 99.5 F (37.5 C)  TempSrc: Oral  SpO2: 99%    General appearance: alert; no distress Eyes: PERRLA; EOMI; conjunctiva normal HENT: [place]; AT; with nasal congestion; turbinates boggy; frontal sinus TTP Neck: supple  Lungs: speaks full sentences without difficulty; unlabored Extremities: no edema Skin: warm and dry Neurologic: normal gait Psychological: alert and cooperative; normal mood and affect  Imaging: DG Chest 2 View  Result Date: 01/09/2022 CLINICAL DATA:  Shortness of breath.  Cough.  Chills. EXAM: CHEST - 2 VIEW COMPARISON:  None Available. FINDINGS: The heart size and mediastinal contours are within normal limits. Both lungs are clear. Surgical screw noted in the left scapular glenoid. IMPRESSION: No active cardiopulmonary disease. Electronically Signed   By: Marlaine Hind M.D.   On: 01/09/2022 17:45    Allergies  Allergen Reactions   Lisinopril Cough    Past Medical History:  Diagnosis Date   Allergy    Anemia    as achild   Anxiety    Arthritis    "all over"   Cataract    bilateral - MD just watching   Depression    Hyperlipidemia    Hypertension    Type 2 diabetes mellitus ([place])    Social History   Socioeconomic History   Marital status: Single    Spouse name: Not on file   Number of children: Not on file   Years of education: Not on file  Highest education level: Not on file  Occupational History   Not on file  Tobacco Use   Smoking status: Never    Passive exposure: Never   Smokeless tobacco: Never  Vaping Use   Vaping Use: Never used  Substance and Sexual Activity   Alcohol use: Never   Drug use: Never   Sexual activity: Not on file  Other Topics Concern   Not on file  Social History Narrative   Not on file   Social Determinants of Health   Financial Resource Strain: Not on file  Food Insecurity: Not on file  Transportation Needs:  Not on file  Physical Activity: Not on file  Stress: Not on file  Social Connections: Not on file  Intimate Partner Violence: Not on file   Family History  Problem Relation Age of Onset   Hypertension Mother    Alzheimer's disease Father    Diabetes Father    Colon cancer Neg Hx    Rectal cancer Neg Hx    Stomach cancer Neg Hx    Colon polyps Neg Hx    Crohn's disease Neg Hx    Esophageal cancer Neg Hx    Past Surgical History:  Procedure Laterality Date   COLONOSCOPY     greater than 10 yrs ago out of state   SHOULDER SURGERY Left 1986   TUBAL LIGATION     UPPER GASTROINTESTINAL ENDOSCOPY       Mardella Layman, MD 01/11/22 [phone]

## 2022-01-19 ENCOUNTER — Encounter: Payer: Self-pay | Admitting: Student

## 2022-01-19 ENCOUNTER — Ambulatory Visit (INDEPENDENT_AMBULATORY_CARE_PROVIDER_SITE_OTHER): Payer: No Typology Code available for payment source | Admitting: Student

## 2022-01-19 VITALS — BP 120/74 | HR 73 | Ht 61.0 in | Wt 146.0 lb

## 2022-01-19 DIAGNOSIS — J069 Acute upper respiratory infection, unspecified: Secondary | ICD-10-CM | POA: Insufficient documentation

## 2022-01-19 DIAGNOSIS — H93232 Hyperacusis, left ear: Secondary | ICD-10-CM

## 2022-01-19 MED ORDER — CETIRIZINE HCL 10 MG PO TABS: 10.0000 mg | ORAL_TABLET | Freq: Every day | ORAL | 11 refills | Status: DC

## 2022-01-19 MED ORDER — FLUTICASONE PROPIONATE 50 MCG/ACT NA SUSP: 2.0000 | Freq: Every day | NASAL | 6 refills | Status: DC

## 2022-01-19 NOTE — Patient Instructions (Signed)
So great to see you today.  I am sorry still not feeling well. Complete your amoxicillin antibiotic as prescribed. Please avoid sticking any objects in your ear including Q-tips. Start using Flonase 2 sprays per nostril daily for 3 days, then 1 spray per nostril daily. Start taking Zyrtec daily.  I sent a referral to ENT because of your ongoing ear fullness and hearing changes. Expect a call to get an appointment scheduled in the next couple weeks.  If you do not hear from [place], please let us know.  Hope you feel better, Dr. Melissa Noon

## 2022-01-19 NOTE — Progress Notes (Signed)
    SUBJECTIVE:   CHIEF COMPLAINT / HPI:   Stacy Nguyen is a 66 year old female here with left ear fullness.  Also some decreased hearing.  Feels a pressure sensation in her ears that alternates between her left and right ear. She was seen in the ED onslaught 01/09/2022 for similar symptoms and diagnosed with sinusitis and was treated with amoxicillin, with today being the last day of prescription.    Denies rhinorrhea.  Denies any fevers, shortness of breath, vomiting or diarrhea. She does have some congestion and a little bit of runny nose.  She has been using Afrin for the past 3 days.  She does have concerns about having meningitis given that her oldest son had ear pressure last year and was found unconscious.  Denies any headaches or confusion.  PERTINENT  PMH / PSH: Hypertension  OBJECTIVE:   BP 120/74   Pulse 73   Ht 5\' 1"  (1.549 m)   Wt 146 lb (66.2 kg)   LMP  (LMP Unknown)   SpO2 99%   BMI 27.59 kg/m   General: Well-appearing, no distress HEENT: No conjunctival injection.  Boggy nasal mucosa.  Erythematous left tympanic membrane with fluid. CV: Regular rate and rhythm Respiratory: Normal work of breathing on room air, no wheezing or crackles Extremities: Warm and well-perfused  ASSESSMENT/PLAN:   URI (upper respiratory infection) 66 year old female, well-appearing with URI symptoms. Likely lingering postviral syndrome.  Also possibly allergic component.  No concern for bacterial infection at this time including pneumonia, meningitis, otitis media.  Left TM appears irritated, likely from use of Q-tips to clean ears. Encouraged her to not use Q-tips or other objects in the ear canal. Encouraged plenty of oral fluid hydration, rest, honey for cough. Prescribed cetirizine and Flonase. ENT referral placed for left ear fullness sensation and hearing changes. Return precautions discussed     78, DO [facility]

## 2022-01-19 NOTE — Assessment & Plan Note (Signed)
66 year old female, well-appearing with URI symptoms. Likely lingering postviral syndrome.  Also possibly allergic component.  No concern for bacterial infection at this time including pneumonia, meningitis, otitis media.  Left TM appears irritated, likely from use of Q-tips to clean ears. Encouraged her to not use Q-tips or other objects in the ear canal. Encouraged plenty of oral fluid hydration, rest, honey for cough. Prescribed cetirizine and Flonase. ENT referral placed for left ear fullness sensation and hearing changes. Return precautions discussed

## 2022-01-22 ENCOUNTER — Other Ambulatory Visit: Payer: Self-pay | Admitting: Student

## 2022-01-22 DIAGNOSIS — Z1231 Encounter for screening mammogram for malignant neoplasm of breast: Secondary | ICD-10-CM

## 2022-01-25 ENCOUNTER — Telehealth: Payer: Self-pay

## 2022-01-25 NOTE — Telephone Encounter (Signed)
Patient calls nurse line regarding concerns with continued issues with ear fullness and decreased hearing. She states that she was told that she has fluid in her ears.   Denies fever or pain. She states that she is unable to establish with ENT until January. She is asking if our office can "get rid of the fluid from her ears or do anything to help with this."  She reports that this has been going on since Thanksgiving. She is requesting provider recommendations for what she can do between now and appointment with ENT.   Daughter is having major surgery tomorrow and patient would have to fly and does not want to make ear problem worse.   Will forward to Dr. Melissa Noon as she saw patient last for this concern.   Veronda Prude, RN

## 2022-01-27 NOTE — Telephone Encounter (Signed)
Patient calls nurse line for update.   Patient was given [employer]. Advised with the holidays we would not be able to get her in to an ENT sooner.   Patient advised if symptoms worsen to schedule a clinic visit.

## 2022-02-16 DIAGNOSIS — H35033 Hypertensive retinopathy, bilateral: Secondary | ICD-10-CM | POA: Diagnosis not present

## 2022-02-16 DIAGNOSIS — H903 Sensorineural hearing loss, bilateral: Secondary | ICD-10-CM | POA: Diagnosis not present

## 2022-02-16 DIAGNOSIS — M26609 Unspecified temporomandibular joint disorder, unspecified side: Secondary | ICD-10-CM | POA: Diagnosis not present

## 2022-02-16 DIAGNOSIS — H35371 Puckering of macula, right eye: Secondary | ICD-10-CM | POA: Diagnosis not present

## 2022-02-17 NOTE — Progress Notes (Signed)
[facility] Note  02/18/2022     CHIEF COMPLAINT Patient presents for Retina Follow Up   HISTORY OF PRESENT ILLNESS: Stacy Nguyen is a 67 y.o. female who presents to the clinic today for:   HPI     Retina Follow Up   In right eye.  This started 1 day ago.  Duration of 1 day.  I, the attending physician,  performed the HPI with the patient and updated documentation appropriately.        Comments   Retina eval per Dr. Katy Fitch ERM OD pt is reporting that she has noticed some blurred vision and more trouble seeing at night time she denies flashes or floaters but has had some in the past just not currently       Last edited by Bernarda Caffey, MD on 02/18/2022  1:09 PM.    Pt is here on the referral of Dr. Katy Fitch for concern of ERM OD, pt states her right eye is weaker than her left eye, but she didn't notice until she started working and was on the computer a lot, pt did not start wearing glasses until she was in her late 30's, pt endorses having hypertension, she states she used to be on metformin for diabetes, but her dr has taken her off of it and says she doesn't need any medication right now, pt denies any eye hx as a child or any unusual events at birth  Referring physician: Debbra Riding, MD [address] [place],   [id number]  HISTORICAL INFORMATION:   Selected notes from the MEDICAL RECORD NUMBER Referred by Dr. Wyatt Portela for concern of ERM LEE:  Ocular Hx- PMH-    CURRENT MEDICATIONS: Current Outpatient Medications (Ophthalmic Drugs)  Medication Sig   ketorolac (ACULAR) 0.5 % ophthalmic solution Place 1 drop into the left eye in the morning, at noon, and at bedtime. (Patient not taking: Reported on 08/21/2021)   prednisoLONE acetate (PRED FORTE) 1 % ophthalmic suspension Place 1 drop into the right eye in the morning, at noon, and at bedtime. (Patient not taking: Reported on 08/21/2021)   No current facility-administered  medications for this visit. (Ophthalmic Drugs)   Current Outpatient Medications (Other)  Medication Sig   acetaminophen (TYLENOL) 500 MG tablet Take 500 mg by mouth every 6 (six) hours as needed for mild pain, moderate pain or headache.   amLODipine (NORVASC) 5 MG tablet Take 1 tablet (5 mg total) by mouth at bedtime.   atorvastatin (LIPITOR) 10 MG tablet TAKE 1 TABLET(10 MG) BY MOUTH DAILY   benzonatate (TESSALON) 100 MG capsule Take 1 capsule by mouth every 8 (eight) hours for cough.   cetirizine (ZYRTEC) 10 MG tablet Take 1 tablet (10 mg total) by mouth daily.   citalopram (CELEXA) 20 MG tablet TAKE 1 TABLET(20 MG) BY MOUTH DAILY   diclofenac Sodium (VOLTAREN) 1 % GEL APPLY 2 GRAMS TOPICALLY TO THE AFFECTED AREA FOUR TIMES DAILY   fluticasone (FLONASE) 50 MCG/ACT nasal spray Place 2 sprays into both nostrils daily.   hydrocortisone (ANUSOL-HC) 2.5 % rectal cream Place 1 application rectally 2 (two) times daily.   losartan (COZAAR) 50 MG tablet TAKE 1 TABLET(50 MG) BY MOUTH DAILY   meclizine (ANTIVERT) 25 MG tablet Take 1 tablet (25 mg total) by mouth 3 (three) times daily as needed for dizziness. (Patient not taking: Reported on 08/21/2021)   omeprazole (PRILOSEC) 20 MG capsule Take 1 capsule (20 mg  total) by mouth daily.   polyethylene glycol powder (GLYCOLAX/MIRALAX) 17 GM/SCOOP powder Take 17 g by mouth 2 (two) times daily as needed. (Patient not taking: Reported on 07/21/2021)   No current facility-administered medications for this visit. (Other)   REVIEW OF SYSTEMS: ROS   Positive for: Endocrine, Eyes Last edited by Bernarda Caffey, MD on 02/18/2022  1:09 PM.     ALLERGIES Allergies  Allergen Reactions   Lisinopril Cough   PAST MEDICAL HISTORY Past Medical History:  Diagnosis Date   Allergy    Anemia    as achild   Anxiety    Arthritis    "all over"   Cataract    bilateral - MD just watching   Depression    Hyperlipidemia    Hypertension    Type 2 diabetes mellitus  (HCC)    Past Surgical History:  Procedure Laterality Date   COLONOSCOPY     greater than 10 yrs [place] out of state   SHOULDER SURGERY Left 1986   TUBAL LIGATION     UPPER GASTROINTESTINAL ENDOSCOPY     FAMILY HISTORY Family History  Problem Relation Age of Onset   Hypertension Mother    Alzheimer's disease Father    Diabetes Father    Colon cancer Neg Hx    Rectal cancer Neg Hx    Stomach cancer Neg Hx    Colon polyps Neg Hx    Crohn's disease Neg Hx    Esophageal cancer Neg Hx    SOCIAL HISTORY Social History   Tobacco Use   Smoking status: Never    Passive exposure: Never   Smokeless tobacco: Never  Vaping Use   Vaping Use: Never used  Substance Use Topics   Alcohol use: Never   Drug use: Never       OPHTHALMIC EXAM:  Base Eye Exam     Visual Acuity (Snellen - Linear)       Right Left   Dist cc 20/40 20/30   Dist ph cc NI NI    Correction: Glasses         Tonometry (Tonopen, 9:24 AM)       Right Left   Pressure 10 12         Pupils       Pupils Dark Light Shape React APD   Right PERRL 4 3 Round Brisk None   Left PERRL 4 3 Round Brisk None         Visual Fields       Left Right    Full Full         Extraocular Movement       Right Left    Full, Ortho Full, Ortho         Neuro/Psych     Oriented x3: Yes   Mood/Affect: Normal         Dilation     Both eyes: 2.5% Phenylephrine @ 9:24 AM           Slit Lamp and Fundus Exam     Slit Lamp Exam       Right Left   Lids/Lashes Dermatochalasis - upper lid Dermatochalasis - upper lid   Conjunctiva/Sclera mild melanosis mild melanosis   Cornea arcus, well healed cataract wound mild arcus, well healed cataract wound   Anterior Chamber deep and clear deep and clear   Iris Round and dilated Round and dilated   Lens PC IOL in good position, trace Posterior capsular opacification PC IOL in  good position, 1+ Posterior capsular opacification   Anterior Vitreous Vitreous  syneresis, Posterior vitreous detachment, vitreous condensations Vitreous syneresis, vitreous condensations         Fundus Exam       Right Left   Disc mild Pallor, Sharp rim, 360 PPA Tracce Pallor, Sharp rim, PPA   C/D Ratio 0.2 0.2   Macula Flat, Blunted foveal reflex, RPE mottling and clumping, early atrophy Flat, Blunted foveal reflex, RPE mottling and clumping, No heme or edema   Vessels attenuated, Tortuous mild attenuation, mild tortuosity   Periphery Attached, mild RPE mottling, trace cystic changes, no heme Attached, no heme           IMAGING AND PROCEDURES  Imaging and Procedures for 02/18/2022  OCT, Retina - OU - Both Eyes       Right Eye Quality was good. Central Foveal Thickness: 317. Progression has no prior data. Findings include normal foveal contour, no SRF, myopic contour, epiretinal membrane, intraretinal fluid (Myopic degeneration with foveoschisis and multilaminer schisis inferior mac, mild ERM).   Left Eye Quality was good. Central Foveal Thickness: 272. Progression has no prior data. Findings include normal foveal contour, no IRF, no SRF.   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 CME - cystoid macular edema. PED - pigment epithelial detachment. IRF - intraretinal fluid. SRF - subretinal fluid. EZ - ellipsoid zone. ERM - epiretinal membrane. ORA - outer retinal atrophy. ORT - outer retinal tubulation. SRHM - subretinal hyper-reflective material. IRHM - intraretinal hyper-reflective material            ASSESSMENT/PLAN:    ICD-10-CM   1. 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 Epiretinal membrane (ERM) of right eye  H35.371 OCT, Retina - OU - Both Eyes    3.  Essential hypertension  I10     4. Hypertensive retinopathy of both eyes  H35.033     5. Pseudophakia, both eyes  Z96.1      Myopic degeneration with foveoschisis OD  - BCVA: 20/40  - OCT shows severely myopic contour with central foveoschisis  - discussed findings, prognosis and treatment options  - recommend observation for now  - f/u 4 months, DFE, OCT  2. Epiretinal membrane, right eye  - The natural history, anatomy, potential for loss of vision, and treatment options including vitrectomy techniques and the complications of endophthalmitis, retinal detachment, vitreous hemorrhage, cataract progression and permanent vision loss discussed with the patient. - mild ERM greatest inferior macula with multilaminar schisis - BCVA 20/40 - asymptomatic, no metamorphopsia - no indication for surgery at this time - monitor for now  3,4. Hypertensive retinopathy OU - discussed importance of tight BP control - monitor  5. Pseudophakia OU  - s/p CE/IOL OU  - IOLs in good position, doing well  - monitor  6. ?anisometropic amblyopia  - pt reports OD has "always been weaker" but states she didn't start wearing glasses until her 71s or 30s   Ophthalmic Meds Ordered this visit:  No orders of the defined types were placed in this encounter.    Return in about 4 months (around 06/19/2022) for f/u myopic degeneration OD, DFE, OCT.  There are no Patient Instructions on file for this  visit.  Explained the diagnoses, plan, and follow up with the patient and they expressed understanding.  Patient expressed understanding of the importance of proper follow up care.   This document serves as a record of services personally performed by Gardiner Sleeper, MD, PhD. It was created on their behalf by San Jetty. Owens Shark, OA an ophthalmic technician. The creation of this record is the provider's dictation and/or activities during the visit.    Electronically signed by: San Jetty. [place], [place] 01.10.2024 1:10  PM  Gardiner Sleeper, M.D., Ph.D. Diseases & Surgery of the Retina and Vitreous [facility]  I have reviewed the above documentation for accuracy and completeness, and I agree with the above. Gardiner Sleeper, M.D., Ph.D. 02/18/22 1:13 PM  Abbreviations: M myopia (nearsighted); A astigmatism; H hyperopia (farsighted); P presbyopia; Mrx spectacle prescription;  CTL contact lenses; OD right eye; OS left eye; OU both eyes  XT exotropia; ET esotropia; PEK punctate epithelial keratitis; PEE punctate epithelial erosions; DES dry eye syndrome; MGD meibomian gland dysfunction; ATs artificial tears; PFAT's preservative free artificial tears; [place] nuclear sclerotic cataract; PSC posterior subcapsular cataract; ERM epi-retinal membrane; PVD posterior vitreous detachment; RD retinal detachment; DM diabetes mellitus; DR diabetic retinopathy; NPDR non-proliferative diabetic retinopathy; PDR proliferative diabetic retinopathy; CSME clinically significant macular edema; DME diabetic macular edema; dbh dot blot hemorrhages; CWS cotton wool spot; POAG primary open angle glaucoma; C/D cup-to-disc ratio; HVF humphrey visual field; GVF goldmann visual field; OCT optical coherence tomography; IOP intraocular pressure; BRVO Branch retinal vein occlusion; CRVO central retinal vein occlusion; CRAO central retinal artery occlusion; BRAO branch retinal artery occlusion; RT retinal tear; SB scleral buckle; PPV pars plana vitrectomy; VH Vitreous hemorrhage; PRP panretinal laser photocoagulation; IVK intravitreal kenalog; VMT vitreomacular traction; MH Macular hole;  NVD neovascularization of the disc; NVE neovascularization elsewhere; AREDS age related eye disease study; ARMD age related macular degeneration; POAG primary open angle glaucoma; EBMD epithelial/anterior basement membrane dystrophy; ACIOL anterior chamber intraocular lens; IOL intraocular lens; PCIOL posterior chamber intraocular lens; Phaco/IOL  phacoemulsification with intraocular lens placement; Jones photorefractive keratectomy; LASIK laser assisted in situ keratomileusis; HTN hypertension; DM diabetes mellitus; COPD chronic obstructive pulmonary disease

## 2022-02-18 ENCOUNTER — Encounter (INDEPENDENT_AMBULATORY_CARE_PROVIDER_SITE_OTHER): Payer: Self-pay | Admitting: Ophthalmology

## 2022-02-18 ENCOUNTER — Ambulatory Visit (INDEPENDENT_AMBULATORY_CARE_PROVIDER_SITE_OTHER): Payer: No Typology Code available for payment source | Admitting: Ophthalmology

## 2022-02-18 DIAGNOSIS — Z961 Presence of intraocular lens: Secondary | ICD-10-CM | POA: Diagnosis not present

## 2022-02-18 DIAGNOSIS — H35371 Puckering of macula, right eye: Secondary | ICD-10-CM

## 2022-02-18 DIAGNOSIS — I10 Essential (primary) hypertension: Secondary | ICD-10-CM

## 2022-02-18 DIAGNOSIS — H35033 Hypertensive retinopathy, bilateral: Secondary | ICD-10-CM

## 2022-02-18 DIAGNOSIS — H442D1 Degenerative myopia with foveoschisis, right eye: Secondary | ICD-10-CM | POA: Diagnosis not present

## 2022-02-18 DIAGNOSIS — H3581 Retinal edema: Secondary | ICD-10-CM

## 2022-03-16 ENCOUNTER — Encounter (INDEPENDENT_AMBULATORY_CARE_PROVIDER_SITE_OTHER): Payer: No Typology Code available for payment source | Admitting: Ophthalmology

## 2022-03-19 ENCOUNTER — Ambulatory Visit
Admission: RE | Admit: 2022-03-19 | Discharge: 2022-03-19 | Disposition: A | Discharge status: Home / Self Care | Payer: No Typology Code available for payment source | Source: Ambulatory Visit | Attending: Family Medicine | Admitting: Family Medicine

## 2022-03-19 DIAGNOSIS — Z1231 Encounter for screening mammogram for malignant neoplasm of breast: Secondary | ICD-10-CM | POA: Diagnosis not present

## 2022-04-12 ENCOUNTER — Other Ambulatory Visit: Payer: Self-pay | Admitting: Student

## 2022-04-16 ENCOUNTER — Other Ambulatory Visit: Payer: Self-pay | Admitting: Family Medicine

## 2022-04-16 ENCOUNTER — Encounter: Payer: Self-pay | Admitting: Family Medicine

## 2022-04-16 ENCOUNTER — Ambulatory Visit (INDEPENDENT_AMBULATORY_CARE_PROVIDER_SITE_OTHER): Payer: No Typology Code available for payment source | Admitting: Family Medicine

## 2022-04-16 VITALS — BP 120/70 | HR 74 | Ht 61.0 in | Wt 148.0 lb

## 2022-04-16 DIAGNOSIS — R051 Acute cough: Secondary | ICD-10-CM | POA: Diagnosis not present

## 2022-04-16 DIAGNOSIS — J069 Acute upper respiratory infection, unspecified: Secondary | ICD-10-CM

## 2022-04-16 LAB — POC SOFIA 2 FLU + SARS ANTIGEN FIA
Influenza A, POC: NEGATIVE
Influenza B, POC: NEGATIVE
SARS Coronavirus 2 Ag: NEGATIVE

## 2022-04-16 MED ORDER — BENZONATATE 100 MG PO CAPS: ORAL_CAPSULE | ORAL | 0 refills | Status: DC

## 2022-04-16 MED ORDER — AZELASTINE HCL 0.1 % NA SOLN: 2.0000 | Freq: Two times a day (BID) | NASAL | 0 refills | Status: DC

## 2022-04-16 NOTE — Progress Notes (Signed)
    SUBJECTIVE:   CHIEF COMPLAINT / HPI:   Sick symptoms - Started 3 days ago after going to a church function - Cough, runny nose, sneezing, body aches, chills - Highest temp was 99.57F - Wanting to get COVID and Flu tested - Denies any history of asthma - Notes that with her last viral illness she had several weeks of cough  PERTINENT  PMH / PSH: HTN, prediabetes   OBJECTIVE:   BP 120/70   Pulse 74   Ht 5\' 1"  (1.549 m)   Wt 148 lb (67.1 kg)   LMP  (LMP Unknown)   SpO2 97%   BMI 27.96 kg/m   General: appears mildly unwell but not toxic CV: RRR, nor m/r/g appreciated Pulm: breathing comfortably on room air, dry cough during examination, no focal consolidation auscultated HEENT: TM clear bilaterally, mild pharyngeal erythema, no tonsillar exudates, mild cervical LAD  ASSESSMENT/PLAN:   Viral URI Patient presenting with URI symptoms, history and physical consistent with viral URI.  COVID and flu testing in clinic was negative.  Discussed with patient conservative management at home. - Tessalon Perles as needed - Trial of Mucinex and azelastine spray - Return precautions discussed   Stacy Nguyen, [place]

## 2022-04-16 NOTE — Patient Instructions (Addendum)
You most likely have a viral URI, your test was negative for COVID and Flu. There is not a great medication for cough, but I would recommend a trial a over the counter Mucinex to see if that helps some of your symptoms. I have also sent in for Tessalon perrles to take for your cough as needed to see if that helps. The cough can last for up to 8 weeks in some patients.   I am sending in a nasal spray that can sometimes help decrease the amount of discharge you are having.

## 2022-04-21 DIAGNOSIS — I129 Hypertensive chronic kidney disease with stage 1 through stage 4 chronic kidney disease, or unspecified chronic kidney disease: Secondary | ICD-10-CM | POA: Diagnosis not present

## 2022-04-21 DIAGNOSIS — E663 Overweight: Secondary | ICD-10-CM | POA: Diagnosis not present

## 2022-04-21 DIAGNOSIS — Z008 Encounter for other general examination: Secondary | ICD-10-CM | POA: Diagnosis not present

## 2022-04-21 DIAGNOSIS — F324 Major depressive disorder, single episode, in partial remission: Secondary | ICD-10-CM | POA: Diagnosis not present

## 2022-04-21 DIAGNOSIS — E785 Hyperlipidemia, unspecified: Secondary | ICD-10-CM | POA: Diagnosis not present

## 2022-04-21 DIAGNOSIS — N182 Chronic kidney disease, stage 2 (mild): Secondary | ICD-10-CM | POA: Diagnosis not present

## 2022-04-21 DIAGNOSIS — E1169 Type 2 diabetes mellitus with other specified complication: Secondary | ICD-10-CM | POA: Diagnosis not present

## 2022-04-21 DIAGNOSIS — Z6828 Body mass index (BMI) 28.0-28.9, adult: Secondary | ICD-10-CM | POA: Diagnosis not present

## 2022-04-21 DIAGNOSIS — E1122 Type 2 diabetes mellitus with diabetic chronic kidney disease: Secondary | ICD-10-CM | POA: Diagnosis not present

## 2022-04-23 ENCOUNTER — Other Ambulatory Visit: Payer: Self-pay

## 2022-04-23 DIAGNOSIS — E785 Hyperlipidemia, unspecified: Secondary | ICD-10-CM

## 2022-04-23 MED ORDER — ATORVASTATIN CALCIUM 10 MG PO TABS: ORAL_TABLET | ORAL | 3 refills | Status: DC

## 2022-04-27 DIAGNOSIS — M8580 Other specified disorders of bone density and structure, unspecified site: Secondary | ICD-10-CM | POA: Insufficient documentation

## 2022-04-27 NOTE — Progress Notes (Unsigned)
    SUBJECTIVE:   Chief compliant/HPI: annual examination  Stacy Nguyen is a 67 y.o. who presents today for an annual exam.   History tabs reviewed and updated.   OBJECTIVE:  BP 112/74   Pulse 74   Ht 5\' 1"  (1.549 m)   Wt 148 lb (67.1 kg)   LMP  (LMP Unknown)   SpO2 98%   BMI 27.96 kg/m   Gen: well-appearing, NAD HEENT: no appreciable thyromegaly nor nodules CV: RRR, no murmurs auscultated Pulm: CTAB, normal WOB Abdomen: soft, nontender, normoactive bowel sounds  ASSESSMENT/PLAN:  Osteopenia Continue Vitamin D3. Advised calcium supplement.   HTN (hypertension) Well-controlled, continue losartan 50 mg daily.  Hyperlipidemia Well-controlled, continue atorvastatin 10 mg daily.  Thorough discussion had regarding continuing statin management as she has had different recommendations from a member of the family who is a PA.  Counseled on avoiding supplements as if you have any medical efficacy.  Encounter for annual physical exam PHQ score 0, reviewed and discussed.  BP reviewed and at goal.  Advance directives discussion, form provided. States her daughter would be healthcare power of attorney should it be necessary.   Considered the following items based upon USPSTF recommendations: Diabetes screening: ordered Screening for elevated cholesterol: ordered HIV testing: discussed, declined Hepatitis C: discussed, declined Hepatitis B: discussed, declined Syphilis if at high risk: discussed GC/CT not at high risk and not ordered. Osteoporosis screening considered based upon risk of fracture from [facility] calculator. Major osteoporotic fracture risk is 0.2%. DEXA performed on 02/08/2019, T score -1.9. Osteopenic.  Reviewed risk factors for latent tuberculosis and not indicated.  Cervical cancer screening: not indicated given age Breast cancer screening: up to date. Next due in 03/20/23 Colorectal cancer screening: up to date on screening for CRC. Lung cancer screening: not  indicated. See documentation below regarding indications/risks/benefits.  Vaccinations: discussed shingrix.   Depression, major, single episode, mild ([place]) She is on citalopram 20 mg daily for depression which was started during time of her divorce.  She is stable now and request assistance with getting off of it.  Advised reduced to escitalopram 10 mg daily x 1 month, then may discontinue or if still having trouble may take 10 mg every other day x 1 month.   Return in about 1 year (around 04/28/2023) for Annual physical.   Wells Guiles, [place]

## 2022-04-28 ENCOUNTER — Encounter: Payer: Self-pay | Admitting: Student

## 2022-04-28 ENCOUNTER — Ambulatory Visit (INDEPENDENT_AMBULATORY_CARE_PROVIDER_SITE_OTHER): Payer: No Typology Code available for payment source | Admitting: Student

## 2022-04-28 VITALS — BP 112/74 | HR 74 | Ht 61.0 in | Wt 148.0 lb

## 2022-04-28 DIAGNOSIS — M8580 Other specified disorders of bone density and structure, unspecified site: Secondary | ICD-10-CM

## 2022-04-28 DIAGNOSIS — F320 Major depressive disorder, single episode, mild: Secondary | ICD-10-CM | POA: Insufficient documentation

## 2022-04-28 DIAGNOSIS — E785 Hyperlipidemia, unspecified: Secondary | ICD-10-CM | POA: Diagnosis not present

## 2022-04-28 DIAGNOSIS — Z0000 Encounter for general adult medical examination without abnormal findings: Secondary | ICD-10-CM | POA: Diagnosis not present

## 2022-04-28 DIAGNOSIS — R7303 Prediabetes: Secondary | ICD-10-CM | POA: Diagnosis not present

## 2022-04-28 DIAGNOSIS — I10 Essential (primary) hypertension: Secondary | ICD-10-CM | POA: Diagnosis not present

## 2022-04-28 LAB — POCT GLYCOSYLATED HEMOGLOBIN (HGB A1C): HbA1c, POC (prediabetic range): 6.1 % (ref 5.7–6.4)

## 2022-04-28 NOTE — Assessment & Plan Note (Addendum)
Well-controlled, continue atorvastatin 10 mg daily.  Thorough discussion had regarding continuing statin management as she has had different recommendations from a member of the family who is a PA.  Counseled on avoiding supplements as they do not have proven medical efficacy.

## 2022-04-28 NOTE — Patient Instructions (Addendum)
It was great to see you today! Thank you for choosing [facility] for your primary care. Stacy Nguyen was seen for annual physical.  Today we addressed: You may start tapering off the citalopram as you are in better place in your life.  Take half a pill every day x 1 month.  You may then stop.  If you start to feel jittery you may go back to taking half a pill every other day for 1 month. I advise you to take vitamin D and calcium given you have osteopenia. Continue losartan and atorvastatin.  If you haven't already, sign up for My Chart to have easy access to your labs results, and communication with your primary care physician.  You can get the Shingrix vaccine at the pharmacy. You will need a 2nd shot 2-6 months after the first. This vaccine is important to help prevent shingles.  We are checking some labs today. If they are abnormal, I will call you. If they are normal, I will send you a MyChart message (if it is active) or a letter in the mail. If you do not hear about your labs in the next 2 weeks, please call the office. I recommend that you always bring your medications to each appointment as this makes it easy to ensure you are on the correct medications and helps [place] not miss refills when you need them. Call the clinic at [phone] if your symptoms worsen or you have any concerns.  You should return to our clinic Return in about 1 year (around 04/28/2023) for Annual physical. Please arrive 15 minutes before your appointment to ensure smooth check in process.  We appreciate your efforts in making this happen.  Thank you for allowing me to participate in your care, Wells Guiles, DO 04/28/2022, 10:31 AM PGY-2, [place]

## 2022-04-28 NOTE — Assessment & Plan Note (Signed)
Continue Vitamin D3. Advised calcium supplement.

## 2022-04-28 NOTE — Assessment & Plan Note (Addendum)
Well-controlled, continue losartan 50 mg daily.

## 2022-04-28 NOTE — Assessment & Plan Note (Signed)
PHQ score 0, reviewed and discussed.  BP reviewed and at goal.  Advance directives discussion, form provided. States her daughter would be healthcare power of attorney should it be necessary.   Considered the following items based upon USPSTF recommendations: Diabetes screening: ordered Screening for elevated cholesterol: ordered HIV testing: discussed, declined Hepatitis C: discussed, declined Hepatitis B: discussed, declined Syphilis if at high risk: discussed GC/CT not at high risk and not ordered. Osteoporosis screening considered based upon risk of fracture from [facility] calculator. Major osteoporotic fracture risk is 0.2%. DEXA performed on 02/08/2019, T score -1.9. Osteopenic.  Reviewed risk factors for latent tuberculosis and not indicated.  Cervical cancer screening: not indicated given age Breast cancer screening: up to date. Next due in 03/20/23 Colorectal cancer screening: up to date on screening for CRC. Lung cancer screening: not indicated. See documentation below regarding indications/risks/benefits.  Vaccinations: discussed shingrix.

## 2022-04-28 NOTE — Assessment & Plan Note (Signed)
She is on citalopram 20 mg daily for depression which was started during time of her divorce.  She is stable now and request assistance with getting off of it.  Advised reduced to escitalopram 10 mg daily x 1 month, then may discontinue or if still having trouble may take 10 mg every other day x 1 month.

## 2022-04-29 LAB — COMPREHENSIVE METABOLIC PANEL
ALT: 27 IU/L (ref 0–32)
AST: 24 IU/L (ref 0–40)
Albumin/Globulin Ratio: 1.6 (ref 1.2–2.2)
Albumin: 4.4 g/dL (ref 3.9–4.9)
Alkaline Phosphatase: 87 IU/L (ref 44–121)
BUN/Creatinine Ratio: 15 (ref 12–28)
BUN: 13 mg/dL (ref 8–27)
Bilirubin Total: 0.3 mg/dL (ref 0.0–1.2)
CO2: 24 mmol/L (ref 20–29)
Calcium: 9.6 mg/dL (ref 8.7–10.3)
Chloride: 105 mmol/L (ref 96–106)
Creatinine, Ser: 0.89 mg/dL (ref 0.57–1.00)
Globulin, Total: 2.7 g/dL (ref 1.5–4.5)
Glucose: 103 mg/dL — ABNORMAL HIGH (ref 70–99)
Potassium: 4.5 mmol/L (ref 3.5–5.2)
Sodium: 143 mmol/L (ref 134–144)
Total Protein: 7.1 g/dL (ref 6.0–8.5)
eGFR: 71 mL/min/{1.73_m2} (ref >59)

## 2022-04-29 LAB — LIPID PANEL
Chol/HDL Ratio: 3 ratio (ref 0.0–4.4)
Cholesterol, Total: 150 mg/dL (ref 100–199)
HDL: 50 mg/dL (ref >39)
LDL Chol Calc (NIH): 88 mg/dL (ref 0–99)
Triglycerides: 60 mg/dL (ref 0–149)
VLDL Cholesterol Cal: 12 mg/dL (ref 5–40)

## 2022-06-17 NOTE — Progress Notes (Signed)
[facility] - Clinic Note  06/18/2022     CHIEF COMPLAINT Patient presents for Retina Follow Up   HISTORY OF PRESENT ILLNESS: Stacy Nguyen is a 67 y.o. female who presents to the clinic today for:   HPI     Retina Follow Up   Patient presents with  Other.  In right eye.  This started 4 months ago.  I, the attending physician,  performed the HPI with the patient and updated documentation appropriately.        Comments   Patient here for 4 months retina follow up for Degenerative Myopia OD. Patient states vision is ok. Not the best. When on the computer too much, strains to look up- like muscle being pulled.       Last edited by Rennis Chris, MD on 06/21/2022  2:03 AM.    Pt feels like her vision is getting worse, she is still following with Dr. Dione Booze  Referring physician: [name], DO [address] [place],  [place] [address]  HISTORICAL INFORMATION:   Selected notes from the MEDICAL RECORD NUMBER Referred by Dr. Fabian Sharp for concern of ERM LEE:  Ocular Hx- PMH-    CURRENT MEDICATIONS: No current outpatient medications on file. (Ophthalmic Drugs)   No current facility-administered medications for this visit. (Ophthalmic Drugs)   Current Outpatient Medications (Other)  Medication Sig   acetaminophen (TYLENOL) 500 MG tablet Take 500 mg by mouth every 6 (six) hours as needed for mild pain, moderate pain or headache.   amLODipine (NORVASC) 5 MG tablet Take 1 tablet (5 mg total) by mouth at bedtime.   atorvastatin (LIPITOR) 10 MG tablet TAKE 1 TABLET(10 MG) BY MOUTH DAILY   azelastine (ASTELIN) 0.1 % nasal spray ADMINISTER 2 SPRAYS INTO BOTH NOSTRILS TWICE DAILY   benzonatate (TESSALON) 100 MG capsule Take 1 capsule by mouth every 8 (eight) hours for cough.   cetirizine (ZYRTEC) 10 MG tablet Take 1 tablet (10 mg total) by mouth daily.   Cholecalciferol (VITAMIN D3) 50 MCG (2000 UT) CAPS Take 1 capsule by mouth daily.   citalopram  (CELEXA) 20 MG tablet TAKE 1 TABLET(20 MG) BY MOUTH DAILY   fluticasone (FLONASE) 50 MCG/ACT nasal spray Place 2 sprays into both nostrils daily.   losartan (COZAAR) 50 MG tablet TAKE 1 TABLET(50 MG) BY MOUTH DAILY   No current facility-administered medications for this visit. (Other)   REVIEW OF SYSTEMS: ROS   Positive for: Endocrine, Eyes Last edited by Laddie Aquas, COA on 06/18/2022 10:30 AM.      ALLERGIES Allergies  Allergen Reactions   Lisinopril Cough   PAST MEDICAL HISTORY Past Medical History:  Diagnosis Date   Allergy    Anemia    as achild   Anxiety    Arthritis    "all over"   Cataract    bilateral - MD just watching   Depression    Hyperlipidemia    Hypertension    Type 2 diabetes mellitus (HCC)    Vertigo 08/03/2021   Past Surgical History:  Procedure Laterality Date   COLONOSCOPY     greater than 10 [place]rs [place] out of state   SHOULDER SURGERY Left 1986   TUBAL LIGATION     UPPER GASTROINTESTINAL ENDOSCOPY     FAMILY HISTORY Family History  Problem Relation Age of Onset   Hypertension Mother    Alzheimer's disease Father    Diabetes Father    Colon cancer Neg Hx  Rectal cancer Neg Hx    Stomach cancer Neg Hx    Colon polyps Neg Hx    Crohn's disease Neg Hx    Esophageal cancer Neg Hx    SOCIAL HISTORY Social History   Tobacco Use   Smoking status: Never    Passive exposure: Never   Smokeless tobacco: Never  Vaping Use   Vaping Use: Never used  Substance Use Topics   Alcohol use: Never   Drug use: Never       OPHTHALMIC EXAM:  Base Eye Exam     Visual Acuity (Snellen - Linear)       Right Left   Dist cc 20/25 -2 20/20   Dist ph cc NI     Correction: Glasses         Tonometry (Tonopen, 10:27 AM)       Right Left   Pressure 12 13         Pupils       Dark Light Shape React APD   Right 3 2 Round Brisk None   Left 3 2 Round Brisk None         Visual Fields (Counting fingers)       Left Right    Full  Full         Extraocular Movement       Right Left    Full, Ortho Full, Ortho         Neuro/Psych     Oriented x3: Yes   Mood/Affect: Normal         Dilation     Both eyes: 1.0% Mydriacyl, 2.5% Phenylephrine @ 10:26 AM           Slit Lamp and Fundus Exam     Slit Lamp Exam       Right Left   Lids/Lashes Dermatochalasis - upper lid Dermatochalasis - upper lid   Conjunctiva/Sclera mild melanosis melanosis, mild Conjunctivochalasis inferiorly   Cornea arcus, well healed cataract wound mild arcus, well healed cataract wound   Anterior Chamber deep and clear deep and clear   Iris Round and dilated Round and dilated   Lens PC IOL in good position, trace Posterior capsular opacification PC IOL in good position, 1+ Posterior capsular opacification   Anterior Vitreous Vitreous syneresis, Posterior vitreous detachment, vitreous condensations Vitreous syneresis, vitreous condensations         Fundus Exam       Right Left   Disc mild Pallor, Sharp rim, 360 PPA Trace Pallor, Sharp rim, PPA   C/D Ratio 0.2 0.2   Macula Flat, Blunted foveal reflex, RPE mottling and clumping, early atrophy Flat, Blunted foveal reflex, RPE mottling and clumping, No heme or edema   Vessels attenuated, Tortuous attenuated, mild tortuosity   Periphery Attached, no heme Attached, no heme           Refraction     Wearing Rx       Sphere Cylinder Axis Add   Right -1.00 +1.00 173 +2.50   Left -0.50 Sphere  +2.50           IMAGING AND PROCEDURES  Imaging and Procedures for 06/18/2022  OCT, Retina - OU - Both Eyes       Right Eye Quality was good. Central Foveal Thickness: 307. Progression has been stable. Findings include normal foveal contour, no SRF, myopic contour, epiretinal membrane, intraretinal fluid (Myopic degeneration with foveoschisis and multilaminer schisis inferior mac, mild ERM).   Left Eye Quality was  good. Central Foveal Thickness: 247. Progression has been  stable. Findings include normal foveal contour, no IRF, no SRF.   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 CME - cystoid macular edema. PED - pigment epithelial detachment. IRF - intraretinal fluid. SRF - subretinal fluid. EZ - ellipsoid zone. ERM - epiretinal membrane. ORA - outer retinal atrophy. ORT - outer retinal tubulation. SRHM - subretinal hyper-reflective material. IRHM - intraretinal hyper-reflective material            ASSESSMENT/PLAN:    ICD-10-CM   1. 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 Epiretinal membrane (ERM) of right eye  H35.371     3. Essential hypertension  I10     4. Hypertensive retinopathy of both eyes  H35.033     5. Pseudophakia, both eyes  Z96.1      Myopic degeneration with foveoschisis OD  - BCVA: 20/25 improved from 20/40  - OCT shows severely myopic contour with central foveoschisis  - discussed findings, prognosis - no retinal or ophthalmic interventions indicated or recommended - monitor  - f/u 9 months, DFE, OCT  2. Epiretinal membrane, right eye  - mild ERM greatest inferior macula with multilaminar schisis - BCVA 20/25 - asymptomatic, no metamorphopsia - no indication for surgery at this time - monitor for now  3,4. Hypertensive retinopathy OU - discussed importance of tight BP control - monitor  5. Pseudophakia OU  - s/p CE/IOL OU  - IOLs in good position, doing well  - monitor  6. ?anisometropic amblyopia  - pt reports OD has "always been weaker" but states she didn't start wearing glasses until her 20s or 30s   Ophthalmic Meds Ordered this visit:  No orders of the defined types were placed in this encounter.    Return in about 9 months  (around 03/21/2023) for f/u myopic degeneration OD, DFE, OCT.  There are no Patient Instructions on file for this visit.  This document serves as a record of services personally performed by Karie Chimera, MD, PhD. It was created on their behalf by Berlin Hun COT, an ophthalmic technician. The creation of this record is the provider's dictation and/or activities during the visit.    Electronically signed by: Berlin Hun COT 5.9.20242:04 AM  This document serves as a record of services personally performed by Karie Chimera, MD, PhD. It was created on their behalf by Glee Arvin. Manson Passey, OA an ophthalmic technician. The creation of this record is the provider's dictation and/or activities during the visit.    Electronically signed by: Glee Arvin. Manson Passey, [place] 05.10.2024 2:04 AM  Karie Chimera, M.D., Ph.D. Diseases & Surgery of the Retina and Vitreous [facility] 06/18/2022   I have reviewed the above documentation for accuracy and completeness, and I agree with the above. Karie Chimera, M.D., Ph.D. 06/21/22 2:05 AM   Abbreviations: M myopia (nearsighted); A astigmatism; H hyperopia (farsighted); P presbyopia; Mrx spectacle prescription;  CTL contact lenses; OD right eye; OS left eye; OU both eyes  XT exotropia; ET esotropia; PEK punctate epithelial keratitis; PEE punctate epithelial erosions; DES dry eye syndrome; MGD meibomian gland dysfunction; ATs artificial tears; PFAT's preservative free artificial tears; NSC nuclear sclerotic  cataract; PSC posterior subcapsular cataract; ERM epi-retinal membrane; PVD posterior vitreous detachment; RD retinal detachment; DM diabetes mellitus; DR diabetic retinopathy; NPDR non-proliferative diabetic retinopathy; PDR proliferative diabetic retinopathy; CSME clinically significant macular edema; DME diabetic macular edema; dbh dot blot hemorrhages; CWS cotton wool spot; POAG primary open angle glaucoma; C/D cup-to-disc  ratio; HVF humphrey visual field; GVF goldmann visual field; OCT optical coherence tomography; IOP intraocular pressure; BRVO Branch retinal vein occlusion; CRVO central retinal vein occlusion; CRAO central retinal artery occlusion; BRAO branch retinal artery occlusion; RT retinal tear; SB scleral buckle; PPV pars plana vitrectomy; VH Vitreous hemorrhage; PRP panretinal laser photocoagulation; IVK intravitreal kenalog; VMT vitreomacular traction; MH Macular hole;  NVD neovascularization of the disc; NVE neovascularization elsewhere; AREDS age related eye disease study; ARMD age related macular degeneration; POAG primary open angle glaucoma; EBMD epithelial/anterior basement membrane dystrophy; ACIOL anterior chamber intraocular lens; IOL intraocular lens; PCIOL posterior chamber intraocular lens; Phaco/IOL phacoemulsification with intraocular lens placement; PRK photorefractive keratectomy; LASIK laser assisted in situ keratomileusis; HTN hypertension; DM diabetes mellitus; COPD chronic obstructive pulmonary disease

## 2022-06-18 ENCOUNTER — Encounter (INDEPENDENT_AMBULATORY_CARE_PROVIDER_SITE_OTHER): Payer: Self-pay | Admitting: Ophthalmology

## 2022-06-18 ENCOUNTER — Ambulatory Visit (INDEPENDENT_AMBULATORY_CARE_PROVIDER_SITE_OTHER): Payer: No Typology Code available for payment source | Admitting: Ophthalmology

## 2022-06-18 DIAGNOSIS — H442D1 Degenerative myopia with foveoschisis, right eye: Secondary | ICD-10-CM

## 2022-06-18 DIAGNOSIS — I10 Essential (primary) hypertension: Secondary | ICD-10-CM

## 2022-06-18 DIAGNOSIS — Z961 Presence of intraocular lens: Secondary | ICD-10-CM | POA: Diagnosis not present

## 2022-06-18 DIAGNOSIS — H35033 Hypertensive retinopathy, bilateral: Secondary | ICD-10-CM

## 2022-06-18 DIAGNOSIS — H35371 Puckering of macula, right eye: Secondary | ICD-10-CM | POA: Diagnosis not present

## 2022-06-21 ENCOUNTER — Encounter (INDEPENDENT_AMBULATORY_CARE_PROVIDER_SITE_OTHER): Payer: Self-pay | Admitting: Ophthalmology

## 2022-07-16 ENCOUNTER — Encounter (INDEPENDENT_AMBULATORY_CARE_PROVIDER_SITE_OTHER): Payer: No Typology Code available for payment source | Admitting: Ophthalmology

## 2022-07-26 DIAGNOSIS — H52221 Regular astigmatism, right eye: Secondary | ICD-10-CM | POA: Diagnosis not present

## 2022-07-26 DIAGNOSIS — H524 Presbyopia: Secondary | ICD-10-CM | POA: Diagnosis not present

## 2022-08-09 ENCOUNTER — Ambulatory Visit: Payer: No Typology Code available for payment source | Admitting: Student

## 2022-08-10 ENCOUNTER — Other Ambulatory Visit: Payer: Self-pay

## 2022-08-10 DIAGNOSIS — I10 Essential (primary) hypertension: Secondary | ICD-10-CM

## 2022-08-10 MED ORDER — LOSARTAN POTASSIUM 50 MG PO TABS: ORAL_TABLET | ORAL | 3 refills | Status: DC

## 2022-08-18 NOTE — Progress Notes (Signed)
  SUBJECTIVE:   CHIEF COMPLAINT / HPI:   She notes that she was around family members last week who tested positive for COVID yesterday.  She is requesting be tested.  She is not having any symptoms.  She also presents wanting to discuss concerns for sleep apnea.  She had a sleep study approximately 20 years ago in which she was positive for sleep apnea but was never set up for CPAP.  Further, her family complains that she snores very loudly at night.  She does not feel well rested in the morning when she wakes up.  She does watch TV prior to bedtime.  She only has 1 nighttime awakening which is related to needing to go to the bathroom.  PERTINENT  PMH / PSH: HTN, HLD, prediabetes  Patient Care Team: [name], DO as PCP - General (Family Medicine) OBJECTIVE:  BP 139/80   Pulse 60   Temp 97.9 F (36.6 C)   Ht 5\' 1"  (1.549 m)   Wt 150 lb 6.4 oz (68.2 kg)   LMP  (LMP Unknown)   SpO2 100%   BMI 28.42 kg/m  Physical Exam Constitutional:      Appearance: Normal appearance.  HENT:     Mouth/Throat:     Mouth: Mucous membranes are moist.     Pharynx: Oropharynx is clear. No oropharyngeal exudate or posterior oropharyngeal erythema.  Cardiovascular:     Rate and Rhythm: Normal rate and regular rhythm.     Heart sounds: Normal heart sounds.  Pulmonary:     Breath sounds: Normal breath sounds.  Musculoskeletal:     Cervical back: Normal range of motion and neck supple. No tenderness.  Neurological:     Mental Status: She is alert.    ASSESSMENT/PLAN:  Snoring Assessment & Plan: History is concerning for sleep apnea.  Discussed sleep hygiene and ordered sleep study today.  Orders: -     Nocturnal polysomnography; Future  Depression, major, single episode, mild (HCC) Assessment & Plan: Doing well on citalopram 10 mg daily.  Discontinue today.   Exposure to COVID-19 virus -     POC SOFIA Antigen FIA  [name], DO 08/19/2022, 11:51 AM PGY-3, [facility]

## 2022-08-19 ENCOUNTER — Encounter: Payer: Self-pay | Admitting: Student

## 2022-08-19 ENCOUNTER — Ambulatory Visit: Payer: No Typology Code available for payment source | Admitting: Student

## 2022-08-19 VITALS — BP 139/80 | HR 60 | Temp 97.9°F | Ht 61.0 in | Wt 150.4 lb

## 2022-08-19 DIAGNOSIS — Z20822 Contact with and (suspected) exposure to covid-19: Secondary | ICD-10-CM | POA: Diagnosis not present

## 2022-08-19 DIAGNOSIS — R0683 Snoring: Secondary | ICD-10-CM | POA: Insufficient documentation

## 2022-08-19 DIAGNOSIS — F320 Major depressive disorder, single episode, mild: Secondary | ICD-10-CM

## 2022-08-19 LAB — POC SOFIA SARS ANTIGEN FIA: SARS Coronavirus 2 Ag: NEGATIVE

## 2022-08-19 NOTE — Assessment & Plan Note (Signed)
Doing well on citalopram 10 mg daily.  Discontinue today.

## 2022-08-19 NOTE — Assessment & Plan Note (Signed)
History is concerning for sleep apnea.  Discussed sleep hygiene and ordered sleep study today.

## 2022-08-19 NOTE — Patient Instructions (Addendum)
It was great to see you today! Thank you for choosing [facility] for your primary care. Stacy Nguyen was seen for concern for sleep apnea.  Today we addressed: Ordered sleep study.  If you would like to discontinue the citalopram since you are doing so well, go for it.  I will be in touch with you regarding the COVID swab.  Good sleep habits (sometimes referred to as "sleep hygiene") can help you get a good night's sleep. Be consistent. Go to bed at the same time each night and get up at the same time each morning, including on the weekends  - Try the following to help you sleep better:  - limit naps during the day  - no screens (TV, phone, tablet, computer) at least 1-2 hours before bedtime.  - have a quiet and dark sleeping environment.  - no large meals or drinks about 1 hour before bed.  - avoid caffeine or alcohol 4-5 hours before bed.  - Avoid taking diuretics (hydrochlorothiazide, furosemide) in the evenings.  - Exercise or move your body regularly every day. Being physically active during the day can help you fall asleep more easily at night. - You can also try melatonin 10 mg over the counter. Take this 1-2 hours before bed. You can increase to 20mg  if this is not helpful. - If you are lying in bed for 30 mins-1 hour and aren't falling asleep, get out of bed and do something relaxing like reading (NO TV!) until you are tired.  If you haven't already, sign up for My Chart to have easy access to your labs results, and communication with your primary care physician.  Please arrive 15 minutes before your appointment to ensure smooth check in process.  We appreciate your efforts in making this happen.  Thank you for allowing me to participate in your care, [name], DO 08/19/2022, 11:42 AM PGY-3, [facility]

## 2022-08-23 ENCOUNTER — Ambulatory Visit: Payer: No Typology Code available for payment source

## 2022-08-24 ENCOUNTER — Other Ambulatory Visit: Payer: Self-pay | Admitting: Student

## 2022-08-24 ENCOUNTER — Other Ambulatory Visit: Payer: Self-pay | Admitting: Family Medicine

## 2022-08-24 DIAGNOSIS — I10 Essential (primary) hypertension: Secondary | ICD-10-CM

## 2022-10-24 NOTE — Patient Instructions (Incomplete)
Ms. Kaszuba , Thank you for taking time to come for your Medicare Wellness Visit. I appreciate your ongoing commitment to your health goals. Please review the following plan we discussed and let me know if I can assist you in the future.   Referrals/Orders/Follow-Ups/Clinician Recommendations: Aim for 30 minutes of exercise or brisk walking, 6-8 glasses of water, and 5 servings of fruits and vegetables each day.  This is a list of the screening recommended for you and due dates:  Health Maintenance  Topic Date Due   Zoster (Shingles) Vaccine (1 of 2) Never done   COVID-19 Vaccine (3 - 2023-24 season) 10/10/2022   Flu Shot  05/09/2023*   Yearly kidney health urinalysis for diabetes  04/28/2027*   Yearly kidney function blood test for diabetes  04/28/2023   Medicare Annual Wellness Visit  10/25/2023   Mammogram  03/19/2024   DTaP/Tdap/Td vaccine (2 - Td or Tdap) 02/20/2031   Colon Cancer Screening  08/19/2031   Pneumonia Vaccine  Completed   DEXA scan (bone density measurement)  Completed   Hepatitis C Screening  Completed   HPV Vaccine  Aged Out  *Topic was postponed. The date shown is not the original due date.    Advanced directives: (ACP Link)Information on Advanced Care Planning can be found at [facility] [place] Advance Health Care Directives Advance Health Care Directives ([email or web address]   Next Medicare Annual Wellness Visit scheduled for next year: Yes

## 2022-10-25 ENCOUNTER — Ambulatory Visit: Payer: No Typology Code available for payment source

## 2022-10-25 VITALS — Ht 61.0 in | Wt 150.0 lb

## 2022-10-25 DIAGNOSIS — Z0000 Encounter for general adult medical examination without abnormal findings: Secondary | ICD-10-CM | POA: Diagnosis not present

## 2022-10-25 NOTE — Progress Notes (Signed)
Subjective:   Stacy Nguyen is a 67 y.o. female who presents for an Initial Medicare Annual Wellness Visit.  Visit Complete: Virtual  I connected with  Shara Blazing on 10/25/22 by a audio enabled telemedicine application and verified that I am speaking with the correct person using two identifiers.  Patient Location: Home  Provider Location: Home Office  I discussed the limitations of evaluation and management by telemedicine. The patient expressed understanding and agreed to proceed.  Vital Signs: Because this visit was a virtual/telehealth visit, some criteria may be missing or patient reported. Any vitals not documented were not able to be obtained and vitals that have been documented are patient reported.   Cardiac Risk Factors include: advanced age (>22men, >49 women);dyslipidemia;hypertension     Objective:    Today's Vitals   10/25/22 0821  Weight: 150 lb (68 kg)  Height: 5\' 1"  (1.549 m)   Body mass index is 28.34 kg/m.     10/25/2022    9:26 AM 04/28/2022    9:41 AM 04/16/2022   10:07 AM 01/19/2022   10:03 AM 10/20/2021    9:44 AM 10/02/2021    4:35 PM 07/31/2021    8:46 AM  Advanced Directives  Does Patient Have a Medical Advance Directive? No  No No No No No  Would patient like information on creating a medical advance directive? Yes (MAU/Ambulatory/Procedural Areas - Information given) No - Patient declined No - Patient declined No - Patient declined No - Patient declined  No - Patient declined    Current Medications (verified) Outpatient Encounter Medications as of 10/25/2022  Medication Sig   acetaminophen (TYLENOL) 500 MG tablet Take 500 mg by mouth every 6 (six) hours as needed for mild pain, moderate pain or headache.   amLODipine (NORVASC) 5 MG tablet TAKE 1 TABLET(5 MG) BY MOUTH AT BEDTIME   atorvastatin (LIPITOR) 10 MG tablet TAKE 1 TABLET(10 MG) BY MOUTH DAILY   azelastine (ASTELIN) 0.1 % nasal spray ADMINISTER 2 SPRAYS INTO BOTH NOSTRILS TWICE  DAILY   cetirizine (ZYRTEC) 10 MG tablet Take 1 tablet (10 mg total) by mouth daily.   Cholecalciferol (VITAMIN D3) 50 MCG (2000 UT) CAPS Take 1 capsule by mouth daily.   fluticasone (FLONASE) 50 MCG/ACT nasal spray Place 2 sprays into both nostrils daily.   losartan (COZAAR) 50 MG tablet TAKE 1 TABLET(50 MG) BY MOUTH DAILY   No facility-administered encounter medications on file as of 10/25/2022.    Allergies (verified) Lisinopril   History: Past Medical History:  Diagnosis Date   Allergy    Anemia    as achild   Anxiety    Arthritis    "all over"   Cataract    bilateral - MD just watching   Depression    Hyperlipidemia    Hypertension    Type 2 diabetes mellitus (HCC)    Vertigo 08/03/2021   Past Surgical History:  Procedure Laterality Date   COLONOSCOPY     greater than 10 yrs ago out of state   SHOULDER SURGERY Left 1986   TUBAL LIGATION     UPPER GASTROINTESTINAL ENDOSCOPY     Family History  Problem Relation Age of Onset   Hypertension Mother    Alzheimer's disease Father    Diabetes Father    Colon cancer Neg Hx    Rectal cancer Neg Hx    Stomach cancer Neg Hx    Colon polyps Neg Hx    Crohn's disease Neg Hx  Esophageal cancer Neg Hx    Social History   Socioeconomic History   Marital status: Single    Spouse name: Not on file   Number of children: Not on file   Years of education: Not on file   Highest education level: Not on file  Occupational History   Not on file  Tobacco Use   Smoking status: Never    Passive exposure: Never   Smokeless tobacco: Never  Vaping Use   Vaping status: Never Used  Substance and Sexual Activity   Alcohol use: Never   Drug use: Never   Sexual activity: Not Currently    Birth control/protection: Post-menopausal  Other Topics Concern   Not on file  Social History Narrative   Not on file   Social Determinants of Health   Financial Resource Strain: Low Risk  (10/25/2022)   Overall Financial Resource Strain  (CARDIA)    Difficulty of Paying Living Expenses: Not hard at all  Food Insecurity: No Food Insecurity (10/25/2022)   Hunger Vital Sign    Worried About Running Out of Food in the Last Year: Never true    Ran Out of Food in the Last Year: Never true  Transportation Needs: No Transportation Needs (10/25/2022)   PRAPARE - Administrator, Civil Service (Medical): No    Lack of Transportation (Non-Medical): No  Physical Activity: Sufficiently Active (10/25/2022)   Exercise Vital Sign    Days of Exercise per Week: 5 days    Minutes of Exercise per Session: 30 min  Stress: No Stress Concern Present (10/25/2022)   [employer] of Occupational Health - Occupational Stress Questionnaire    Feeling of Stress : Not at all  Social Connections: Moderately Integrated (10/25/2022)   Social Connection and Isolation Panel [NHANES]    Frequency of Communication with Friends and Family: More than three times a week    Frequency of Social Gatherings with Friends and Family: Three times a week    Attends Religious Services: 1 to 4 times per year    Active Member of Clubs or Organizations: Yes    Attends Banker Meetings: 1 to 4 times per year    Marital Status: Never married    Tobacco Counseling Counseling given: Not Answered   Clinical Intake:  Pre-visit preparation completed: Yes  Pain : No/denies pain     Diabetes: No  How often do you need to have someone help you when you read instructions, pamphlets, or other written materials from your doctor or pharmacy?: 1 - Never  Interpreter Needed?: No  Information entered by :: Kandis Fantasia LPN   Activities of Daily Living    10/25/2022    9:26 AM  In your present state of health, do you have any difficulty performing the following activities:  Hearing? 0  Vision? 0  Difficulty concentrating or making decisions? 0  Walking or climbing stairs? 0  Dressing or bathing? 0  Doing errands, shopping? 0  Preparing  Food and eating ? N  Using the Toilet? N  In the past six months, have you accidently leaked urine? N  Do you have problems with loss of bowel control? N  Managing your Medications? N  Managing your Finances? N  Housekeeping or managing your Housekeeping? N    Patient Care Team: [name], DO as PCP - General (Family Medicine)  Indicate any recent Medical Services you may have received from other than [facility] providers in the past year (date may be  approximate).     Assessment:   This is a routine wellness examination for Nathaniel.  Hearing/Vision screen Hearing Screening - Comments:: Hearing loss  Vision Screening - Comments:: Wears rx glasses - up to date with routine eye exams with Dr. Vanessa Barbara     Goals Addressed             This Visit's Progress    Remain active and independent        Depression Screen    10/25/2022    9:24 AM 04/28/2022    9:42 AM 04/16/2022   10:07 AM 01/19/2022   10:02 AM 01/05/2022   12:07 PM 10/20/2021    9:44 AM 10/02/2021    4:35 PM  PHQ 2/9 Scores  PHQ - 2 Score 0 0 0 0 1 0 0  PHQ- 9 Score  0 0 1 3 1 2     Fall Risk    10/25/2022    9:26 AM 04/28/2022    9:49 AM 04/16/2022   10:07 AM 01/19/2022   10:03 AM 10/20/2021    9:45 AM  Fall Risk   Falls in the past year? 0 0 0 0 0  Number falls in past yr: 0 0 0    Injury with Fall? 0 0 0    Risk for fall due to : No Fall Risks No Fall Risks     Follow up Falls prevention discussed;Education provided;Falls evaluation completed Falls prevention discussed Falls evaluation completed      MEDICARE RISK AT HOME: Medicare Risk at Home Any stairs in or around the home?: No If so, are there any without handrails?: No Home free of loose throw rugs in walkways, pet beds, electrical cords, etc?: Yes Adequate lighting in your home to reduce risk of falls?: Yes Life alert?: No Use of a cane, walker or w/c?: No Grab bars in the bathroom?: Yes Shower chair or bench in shower?: No Elevated toilet seat  or a handicapped toilet?: No  TIMED UP AND GO:  Was the test performed? No    Cognitive Function:        10/25/2022    9:26 AM  6CIT Screen  What Year? 0 points  What month? 0 points  What time? 0 points  Count back from 20 0 points  Months in reverse 0 points  Repeat phrase 0 points  Total Score 0 points    Immunizations Immunization History  Administered Date(s) Administered   Moderna Sars-Covid-2 Vaccination 11/12/2019, 12/10/2019   PNEUMOCOCCAL CONJUGATE-20 02/19/2021   Tdap 02/19/2021    TDAP status: Up to date  Flu Vaccine status: Declined, Education has been provided regarding the importance of this vaccine but patient still declined. Advised may receive this vaccine at local pharmacy or Health Dept. Aware to provide a copy of the vaccination record if obtained from local pharmacy or Health Dept. Verbalized acceptance and understanding.  Pneumococcal vaccine status: Up to date  Covid-19 vaccine status: Information provided on how to obtain vaccines.   Qualifies for Shingles Vaccine? Yes   Zostavax completed No   Shingrix Completed?: No.    Education has been provided regarding the importance of this vaccine. Patient has been advised to call insurance company to determine out of pocket expense if they have not yet received this vaccine. Advised may also receive vaccine at local pharmacy or Health Dept. Verbalized acceptance and understanding.  Screening Tests Health Maintenance  Topic Date Due   Zoster Vaccines- Shingrix (1 of 2) Never done  COVID-19 Vaccine (3 - 2023-24 season) 10/10/2022   INFLUENZA VACCINE  05/09/2023 (Originally 09/09/2022)   Diabetic kidney evaluation - Urine ACR  04/28/2027 (Originally 11/27/2019)   Diabetic kidney evaluation - eGFR measurement  04/28/2023   Medicare Annual Wellness (AWV)  10/25/2023   MAMMOGRAM  03/19/2024   DTaP/Tdap/Td (2 - Td or Tdap) 02/20/2031   Colonoscopy  08/19/2031   Pneumonia Vaccine 55+ Years old  Completed    DEXA SCAN  Completed   Hepatitis C Screening  Completed   HPV VACCINES  Aged Out    Health Maintenance  Health Maintenance Due  Topic Date Due   Zoster Vaccines- Shingrix (1 of 2) Never done   COVID-19 Vaccine (3 - 2023-24 season) 10/10/2022    Colorectal cancer screening: Type of screening: Colonoscopy. Completed 08/18/21. Repeat every 10 years  Mammogram status: Completed 03/19/22. Repeat every year  Bone Density status: Completed 02/08/19. Results reflect: Bone density results: OSTEOPENIA. Repeat every 2 years.  Lung Cancer Screening: (Low Dose CT Chest recommended if Age 56-80 years, 20 pack-year currently smoking OR have quit w/in 15years.) does not qualify.   Lung Cancer Screening Referral: n/a  Additional Screening:  Hepatitis C Screening: does qualify; Completed 09/14/18  Vision Screening: Recommended annual ophthalmology exams for early detection of glaucoma and other disorders of the eye. Is the patient up to date with their annual eye exam?  Yes  Who is the provider or what is the name of the office in which the patient attends annual eye exams? Dr. Vanessa Barbara  If pt is not established with a provider, would they like to be referred to a provider to establish care? No .   Dental Screening: Recommended annual dental exams for proper oral hygiene  Community Resource Referral / Chronic Care Management: CRR required this visit?  No   CCM required this visit?  No     Plan:     I have personally reviewed and noted the following in the patient's chart:   Medical and social history Use of alcohol, tobacco or illicit drugs  Current medications and supplements including opioid prescriptions. Patient is not currently taking opioid prescriptions. Functional ability and status Nutritional status Physical activity Advanced directives List of other physicians Hospitalizations, surgeries, and ER visits in previous 12 months Vitals Screenings to include cognitive,  depression, and falls Referrals and appointments  In addition, I have reviewed and discussed with patient certain preventive protocols, quality metrics, and best practice recommendations. A written personalized care plan for preventive services as well as general preventive health recommendations were provided to patient.     Kandis Fantasia [place], [place]   1/61/0960   After Visit Summary: (MyChart) Due to this being a telephonic visit, the after visit summary with patients personalized plan was offered to patient via MyChart   Nurse Notes: Patient is asking if referral for sleep study can be placed.

## 2022-12-09 ENCOUNTER — Other Ambulatory Visit: Payer: Self-pay | Admitting: Student

## 2022-12-12 ENCOUNTER — Other Ambulatory Visit: Payer: Self-pay | Admitting: Student

## 2023-02-07 ENCOUNTER — Other Ambulatory Visit: Payer: Self-pay

## 2023-02-07 MED ORDER — CETIRIZINE HCL 10 MG PO TABS: 10.0000 mg | ORAL_TABLET | Freq: Every day | ORAL | 11 refills | Status: DC

## 2023-02-24 ENCOUNTER — Other Ambulatory Visit: Payer: Self-pay | Admitting: Student

## 2023-02-24 DIAGNOSIS — Z1231 Encounter for screening mammogram for malignant neoplasm of breast: Secondary | ICD-10-CM

## 2023-03-05 ENCOUNTER — Ambulatory Visit (HOSPITAL_COMMUNITY)
Admission: EM | Admit: 2023-03-05 | Discharge: 2023-03-05 | Disposition: A | Discharge status: Home / Self Care | Payer: Medicare Other | Attending: Physician Assistant | Admitting: Physician Assistant

## 2023-03-05 ENCOUNTER — Encounter (HOSPITAL_COMMUNITY): Payer: Self-pay | Admitting: Emergency Medicine

## 2023-03-05 DIAGNOSIS — B349 Viral infection, unspecified: Secondary | ICD-10-CM | POA: Diagnosis present

## 2023-03-05 DIAGNOSIS — R509 Fever, unspecified: Secondary | ICD-10-CM | POA: Diagnosis present

## 2023-03-05 LAB — POCT INFLUENZA A/B
Influenza A, POC: NEGATIVE
Influenza B, POC: NEGATIVE

## 2023-03-05 MED ORDER — IBUPROFEN 800 MG PO TABS
ORAL_TABLET | ORAL | Status: AC
  Filled 2023-03-05: qty 1

## 2023-03-05 MED ORDER — ONDANSETRON 4 MG PO TBDP: 4.0000 mg | ORAL_TABLET | Freq: Three times a day (TID) | ORAL | 0 refills | Status: DC | PRN

## 2023-03-05 MED ORDER — IBUPROFEN 800 MG PO TABS
800.0000 mg | ORAL_TABLET | Freq: Once | ORAL | Status: AC
  Administered 2023-03-05: 800 mg via ORAL

## 2023-03-05 NOTE — ED Notes (Signed)
Reports had acetaminophen that in Coricidin around 2pm

## 2023-03-05 NOTE — ED Provider Notes (Signed)
[facility]   MRN: [id number] DOB: 1955/02/10  Subjective:   Stacy Nguyen is a 68 y.o. female presenting for sudden onset of headache, body aches, fever, chills, cough and congestion starting this afternoon.  She feels tired.  She denies any chest pain or shortness of breath.  She is somewhat nauseous.  Denies any diarrhea or stomach pain.  No urinary symptoms.  She took Coricidin and Robitussin this afternoon to try to help with symptoms.  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  She does not take a flu or COVID vaccine.   No current facility-administered medications for this encounter.  Current Outpatient Medications:    acetaminophen (TYLENOL) 500 MG tablet, Take 500 mg by mouth every 6 (six) hours as needed for mild pain, moderate pain or headache., Disp: , Rfl:    amLODipine (NORVASC) 5 MG tablet, TAKE 1 TABLET(5 MG) BY MOUTH AT BEDTIME, Disp: 90 tablet, Rfl: 3   atorvastatin (LIPITOR) 10 MG tablet, TAKE 1 TABLET(10 MG) BY MOUTH DAILY, Disp: 90 tablet, Rfl: 3   azelastine (ASTELIN) 0.1 % nasal spray, USE 2 SPRAYS IN EACH NOSTRIL TWICE DAILY, Disp: 90 mL, Rfl: 0   cetirizine (ZYRTEC) 10 MG tablet, Take 1 tablet (10 mg total) by mouth daily., Disp: 30 tablet, Rfl: 11   Cholecalciferol (VITAMIN D3) 50 MCG (2000 UT) CAPS, Take 1 capsule by mouth daily., Disp: , Rfl:    fluticasone (FLONASE) 50 MCG/ACT nasal spray, SHAKE LIQUID AND USE 2 SPRAYS IN EACH NOSTRIL DAILY, Disp: 16 g, Rfl: 6   losartan (COZAAR) 50 MG tablet, TAKE 1 TABLET(50 MG) BY MOUTH DAILY, Disp: 90 tablet, Rfl: 3   Allergies  Allergen Reactions   Lisinopril Cough    Past Medical History:  Diagnosis Date   Allergy    Anemia    as achild   Anxiety    Arthritis    "all over"   Cataract    bilateral - MD just watching   Depression    Hyperlipidemia    Hypertension    Type 2 diabetes mellitus (HCC)    Vertigo 08/03/2021     Past Surgical History:  Procedure Laterality Date    COLONOSCOPY     greater than 10 yrs ago out of state   SHOULDER SURGERY Left 1986   TUBAL LIGATION     UPPER GASTROINTESTINAL ENDOSCOPY      Family History  Problem Relation Age of Onset   Hypertension Mother    Alzheimer's disease Father    Diabetes Father    Colon cancer Neg Hx    Rectal cancer Neg Hx    Stomach cancer Neg Hx    Colon polyps Neg Hx    Crohn's disease Neg Hx    Esophageal cancer Neg Hx     Social History   Tobacco Use   Smoking status: Never    Passive exposure: Never   Smokeless tobacco: Never  Vaping Use   Vaping status: Never Used  Substance Use Topics   Alcohol use: Never   Drug use: Never    ROS REFER TO HPI FOR PERTINENT POSITIVES AND NEGATIVES   Objective:   Vitals: BP 131/79 (BP Location: Left Arm)   Pulse (!) 110   Temp (!) 102.6 F (39.2 C) (Oral)   Resp 19   LMP  (LMP Unknown)   SpO2 98%   Physical Exam Vitals and nursing note reviewed.  Constitutional:  General: She is not in acute distress.    Appearance: Normal appearance. She is ill-appearing.  HENT:     Head: Normocephalic.     Right Ear: Tympanic membrane, ear canal and external ear normal.     Left Ear: Tympanic membrane, ear canal and external ear normal.     Nose: Congestion present.     Mouth/Throat:     Mouth: Mucous membranes are moist.     Pharynx: No oropharyngeal exudate or posterior oropharyngeal erythema.  Eyes:     Extraocular Movements: Extraocular movements intact.     Conjunctiva/sclera: Conjunctivae normal.     Pupils: Pupils are equal, round, and reactive to light.  Cardiovascular:     Rate and Rhythm: Normal rate and regular rhythm.     Pulses: Normal pulses.     Heart sounds: Normal heart sounds. No murmur heard. Pulmonary:     Effort: Pulmonary effort is normal. No respiratory distress.     Breath sounds: Normal breath sounds. No wheezing.  Musculoskeletal:     Cervical back: Normal range of motion.  Skin:    General: Skin is warm.   Neurological:     Mental Status: She is alert and oriented to person, place, and time.  Psychiatric:        Mood and Affect: Mood normal.        Behavior: Behavior normal.     Results for orders placed or performed during the hospital encounter of 03/05/23 (from the past 24 hours)  POC Influenza A/B     Status: None   Collection Time: 03/05/23  7:08 PM  Result Value Ref Range   Influenza A, POC Negative Negative   Influenza B, POC Negative Negative    Assessment and Plan :   PDMP not reviewed this encounter.  1. Fever, unspecified   2. Viral illness     Patient's presentation is consistent with acute viral illness.  Point-of-care influenza test was negative, although suspect possible false negative due to her presentation.  Also performed COVID-19 testing, we will contact patient if this is positive.  Talked about Paxlovid with her in the event she does have a positive COVID-19 test, patient was hesitant about this idea; would recommend readdressing with her in the event of a positive result.  Very low threshold to present to the ER.  Recommended she go home and rest, try to hydrate.  She can alternate Tylenol and ibuprofen to help with her symptoms. Zofran sent to pharmacy to help with nausea prn.    AllwardtCrist Infante, PA-C 03/05/23 1928

## 2023-03-05 NOTE — Discharge Instructions (Signed)
Good to meet you today.  I am sorry you are not feeling well.  Your influenza test was negative, although there is a possibility of this being a false negative.  We have also run a COVID-19 test and you will be contacted if the result is positive.  At this time, you need to go home and rest.   You may alternate Tylenol and ibuprofen to help with fever and bodyaches.  You need to rest and stay well-hydrated.  Should you suddenly worsen, you need to be seen in the emergency room.  Would not advise returning to work or in a public setting until fever free for 24 hours without fever reducing medicine.  Feel better soon.

## 2023-03-05 NOTE — ED Triage Notes (Signed)
Pt report headache, body aches, cough, chills, and congestion started today. Took Coricidin and Tussin.

## 2023-03-05 NOTE — ED Notes (Signed)
Pt drinking ice water, unable to get temperature.

## 2023-03-06 LAB — SARS CORONAVIRUS 2 (TAT 6-24 HRS): SARS Coronavirus 2: NEGATIVE

## 2023-03-07 ENCOUNTER — Ambulatory Visit: Payer: Medicare Other | Admitting: Family Medicine

## 2023-03-07 VITALS — BP 129/87 | HR 81 | Temp 99.5°F | Ht 61.0 in | Wt 141.0 lb

## 2023-03-07 DIAGNOSIS — R059 Cough, unspecified: Secondary | ICD-10-CM

## 2023-03-07 DIAGNOSIS — J101 Influenza due to other identified influenza virus with other respiratory manifestations: Secondary | ICD-10-CM | POA: Diagnosis not present

## 2023-03-07 DIAGNOSIS — R109 Unspecified abdominal pain: Secondary | ICD-10-CM | POA: Diagnosis not present

## 2023-03-07 LAB — POC SOFIA 2 FLU + SARS ANTIGEN FIA
Influenza A, POC: POSITIVE — AB
Influenza B, POC: NEGATIVE
SARS Coronavirus 2 Ag: NEGATIVE

## 2023-03-07 MED ORDER — ONDANSETRON 4 MG PO TBDP: 4.0000 mg | ORAL_TABLET | Freq: Three times a day (TID) | ORAL | 0 refills | Status: AC | PRN

## 2023-03-07 MED ORDER — OSELTAMIVIR PHOSPHATE 75 MG PO CAPS: 75.0000 mg | ORAL_CAPSULE | Freq: Two times a day (BID) | ORAL | 0 refills | Status: AC

## 2023-03-07 NOTE — Patient Instructions (Addendum)
You tested positive for Flu  Take Tamiflu twice daily for 5 days  Continue to use Zofran 30 minutes - 60 mins before meals   Continue over the counter medications for your symptoms - Tylenol and ibuprofen for fevers  Please seek medical attention if you continue to have fevers for greater than 5-7 days with no improvement

## 2023-03-07 NOTE — Progress Notes (Signed)
    SUBJECTIVE:   CHIEF COMPLAINT / HPI:   Sick since Saturday 1/25  Fevers to 102 - most recently 100.1 this morning Has been taking coricidin HBP, zofran for nausea, ibuprofen Endorses Nausea, vomiting, diarrhea, cough, headaches, body aches Has not been able to keep down food or water today Reports some abdominal pain and flank pain in the setting of coughing  PERTINENT  PMH / PSH: HTN  OBJECTIVE:   BP 129/87   Pulse 81   Temp 99.5 F (37.5 C) (Oral)   Ht 5\' 1"  (1.549 m)   Wt 141 lb (64 kg)   LMP  (LMP Unknown)   SpO2 97%   BMI 26.64 kg/m   General: Ill-appearing Cardiac: RRR, no murmurs auscultated Respiratory: CTAB, coughing frequently Abdomen: soft, mild upper abdominal tenderness.  Mild right-sided CVA tenderness Extremities: warm and well perfused, no edema or cyanosis Skin: warm and dry, no rashes noted Neuro: alert, no obvious focal deficits, speech normal  ASSESSMENT/PLAN:   Assessment & Plan Influenza A 2-day history of symptoms, positive flu a test today.  Slightly out of the window but given her significant symptoms prescribed Tamiflu twice daily x 5 days.  Instructed to use with Zofran.  Discussed supportive care otherwise and return precautions as well as ED precautions if she is unable to keep down any fluids. Flank pain No dysuria or hematuria but with history of fevers and flank pain/CVA tenderness on exam will obtain UA.  However, suspect that most of her symptoms are due to influenza A as above Update - pt unable to provide urine sample; lower suspicion for UTI as mentioned so may consider obtaining UA if symptoms persist    Vonna Drafts, MD [facility]

## 2023-03-14 ENCOUNTER — Ambulatory Visit: Payer: Medicare Other | Admitting: Student

## 2023-03-14 VITALS — BP 119/74 | HR 82 | Wt 139.6 lb

## 2023-03-14 DIAGNOSIS — R5383 Other fatigue: Secondary | ICD-10-CM | POA: Diagnosis not present

## 2023-03-14 NOTE — Patient Instructions (Signed)
It was great to see you today! Thank you for choosing [facility] for your primary care.  Today we addressed: I suspect your flu has resolved however you are still having fatigue from this virus.  Should fevers return, please make me aware.  I recommend symptom management at this time and focusing on proper hydration and nutrition.  I do recommend MiraLAX for the constipation you are having which is most likely related to your decreased intake.  We are going to check some blood work to check on your kidney function.  If you haven't already, sign up for My Chart to have easy access to your labs results, and communication with your primary care physician. We are checking some labs today. If they are abnormal, I will call you. If they are normal, I will send you a MyChart message (if it is active) or a letter in the mail. If you do not hear about your labs in the next 2 weeks, please call the office. Return if symptoms worsen or fail to improve. Please arrive 15 minutes before your appointment to ensure smooth check in process.  We appreciate your efforts in making this happen.  Thank you for allowing me to participate in your care, [name], DO 03/14/2023, 2:13 PM PGY-3, [facility]

## 2023-03-14 NOTE — Progress Notes (Cosign Needed)
  SUBJECTIVE:   CHIEF COMPLAINT / HPI:   Follow up regarding flu. Cough, fatigue has persisted. No longer having fevers. Nausea has resolved. She has constipation now although has been having a poor appetite and not eaten or drink as much as she usually does however she is still attempting to stay hydrated.  She is sleeping more often.  PERTINENT  PMH / PSH: HTN, HLD, prediabetes   OBJECTIVE:  BP 119/74   Pulse 82   Wt 139 lb 9.6 oz (63.3 kg)   LMP  (LMP Unknown)   SpO2 100%   BMI 26.38 kg/m  General: Fatigued appearing, NAD HEENT: Normal TMs, clear oropharynx, no cervical lymphadenopathy CV: RRR, was auscultated Pulm: CTAB, normal WOB  ASSESSMENT/PLAN:   Assessment & Plan Other fatigue Suspect secondary to recent flu.  Recommend conservative management, focusing on hydration and nutrition.  MiraLAX for constipation which is secondary to decreased intake.  Check CBC and kidney function given decreased intake.  Return if symptoms worsen or fail to improve. [name], DO 03/14/2023, 5:29 PM PGY-3,  Family Medicine

## 2023-03-15 ENCOUNTER — Encounter: Payer: Self-pay | Admitting: Student

## 2023-03-15 LAB — BASIC METABOLIC PANEL
BUN/Creatinine Ratio: 8 — ABNORMAL LOW (ref 12–28)
BUN: 8 mg/dL (ref 8–27)
CO2: 24 mmol/L (ref 20–29)
Calcium: 10.1 mg/dL (ref 8.7–10.3)
Chloride: 106 mmol/L (ref 96–106)
Creatinine, Ser: 0.97 mg/dL (ref 0.57–1.00)
Glucose: 174 mg/dL — ABNORMAL HIGH (ref 70–99)
Potassium: 4.5 mmol/L (ref 3.5–5.2)
Sodium: 143 mmol/L (ref 134–144)
eGFR: 64 mL/min/{1.73_m2} (ref >59)

## 2023-03-15 LAB — CBC
Hematocrit: 42.4 % (ref 34.0–46.6)
Hemoglobin: 13 g/dL (ref 11.1–15.9)
MCH: 27 pg (ref 26.6–33.0)
MCHC: 30.7 g/dL — ABNORMAL LOW (ref 31.5–35.7)
MCV: 88 fL (ref 79–97)
Platelets: 251 10*3/uL (ref 150–450)
RBC: 4.81 x10E6/uL (ref 3.77–5.28)
RDW: 13 % (ref 11.7–15.4)
WBC: 4 10*3/uL (ref 3.4–10.8)

## 2023-03-18 ENCOUNTER — Encounter (INDEPENDENT_AMBULATORY_CARE_PROVIDER_SITE_OTHER): Payer: Medicare Other | Admitting: Ophthalmology

## 2023-03-21 ENCOUNTER — Ambulatory Visit
Admission: RE | Admit: 2023-03-21 | Discharge: 2023-03-21 | Disposition: A | Discharge status: Home / Self Care | Payer: Medicare Other | Source: Ambulatory Visit | Attending: Family Medicine | Admitting: Family Medicine

## 2023-03-21 DIAGNOSIS — Z1231 Encounter for screening mammogram for malignant neoplasm of breast: Secondary | ICD-10-CM

## 2023-03-22 NOTE — Progress Notes (Signed)
[facility] - Clinic Note  03/23/2023     CHIEF COMPLAINT Patient presents for Retina Follow Up   HISTORY OF PRESENT ILLNESS: Stacy Nguyen is a 68 y.o. female who presents to the clinic today for:   HPI     Retina Follow Up   In right eye.  This started 9 months ago.  Duration of 9 months.  Since onset it is stable.  I, the attending physician,  performed the HPI with the patient and updated documentation appropriately.        Comments   9 month retina follow up myopic deg OD pt is reporting no vision changes noticed she denies any flashes or floaters pt has been sick the last 3 weeks recovering from the flu       Last edited by Rennis Chris, MD on 03/23/2023 12:26 PM.    Pt feels like vision is the same  Referring physician: Olivia Canter, MD [address] [place],  [place] [address]  HISTORICAL INFORMATION:   Selected notes from the MEDICAL RECORD NUMBER Referred by Dr. Fabian Sharp for concern of ERM LEE:  Ocular Hx- PMH-    CURRENT MEDICATIONS: No current outpatient medications on file. (Ophthalmic Drugs)   No current facility-administered medications for this visit. (Ophthalmic Drugs)   Current Outpatient Medications (Other)  Medication Sig   acetaminophen (TYLENOL) 500 MG tablet Take 500 mg by mouth every 6 (six) hours as needed for mild pain, moderate pain or headache.   amLODipine (NORVASC) 5 MG tablet TAKE 1 TABLET(5 MG) BY MOUTH AT BEDTIME   atorvastatin (LIPITOR) 10 MG tablet TAKE 1 TABLET(10 MG) BY MOUTH DAILY   azelastine (ASTELIN) 0.1 % nasal spray USE 2 SPRAYS IN EACH NOSTRIL TWICE DAILY   cetirizine (ZYRTEC) 10 MG tablet Take 1 tablet (10 mg total) by mouth daily.   Cholecalciferol (VITAMIN D3) 50 MCG (2000 UT) CAPS Take 1 capsule by mouth daily.   fluticasone (FLONASE) 50 MCG/ACT nasal spray SHAKE LIQUID AND USE 2 SPRAYS IN EACH NOSTRIL DAILY   losartan (COZAAR) 50 MG tablet TAKE 1 TABLET(50 MG) BY MOUTH DAILY    ondansetron (ZOFRAN-ODT) 4 MG disintegrating tablet Take 1 tablet (4 mg total) by mouth every 8 (eight) hours as needed for nausea or vomiting.   No current facility-administered medications for this visit. (Other)   REVIEW OF SYSTEMS: ROS   Positive for: Endocrine, Eyes Last edited by Etheleen Mayhew, COT on 03/23/2023  9:25 AM.     ALLERGIES Allergies  Allergen Reactions   Lisinopril Cough   PAST MEDICAL HISTORY Past Medical History:  Diagnosis Date   Allergy    Anemia    as achild   Anxiety    Arthritis    "all over"   Cataract    bilateral - MD just watching   Depression    Hyperlipidemia    Hypertension    Type 2 diabetes mellitus (HCC)    Vertigo 08/03/2021   Past Surgical History:  Procedure Laterality Date   COLONOSCOPY     greater than 10 [place]rs [place]   SHOULDER SURGERY Left 1986   TUBAL LIGATION     UPPER GASTROINTESTINAL ENDOSCOPY     FAMILY HISTORY Family History  Problem Relation Age of Onset   Hypertension Mother    Alzheimer's disease Father    Diabetes Father    Colon cancer Neg Hx    Rectal cancer Neg Hx  Stomach cancer Neg Hx    Colon polyps Neg Hx    Crohn's disease Neg Hx    Esophageal cancer Neg Hx    SOCIAL HISTORY Social History   Tobacco Use   Smoking status: Never    Passive exposure: Never   Smokeless tobacco: Never  Vaping Use   Vaping status: Never Used  Substance Use Topics   Alcohol use: Never   Drug use: Never       OPHTHALMIC EXAM:  Base Eye Exam     Visual Acuity (Snellen - Linear)       Right Left   Dist cc 20/25 20/25   Dist ph cc NI NI         Tonometry (Tonopen, 9:31 AM)       Right Left   Pressure 14 15         Pupils       Pupils Dark Light Shape React APD   Right PERRL 3 2 Round Brisk None   Left PERRL 3 2 Round Brisk None         Visual Fields       Left Right    Full Full         Extraocular Movement       Right Left    Full, Ortho Full, Ortho          Neuro/Psych     Oriented x3: Yes   Mood/Affect: Normal         Dilation     Both eyes: 2.5% Phenylephrine @ 9:30 AM           Slit Lamp and Fundus Exam     Slit Lamp Exam       Right Left   Lids/Lashes Dermatochalasis - upper lid Dermatochalasis - upper lid   Conjunctiva/Sclera mild melanosis melanosis   Cornea arcus, well healed cataract wound mild arcus, well healed cataract wound, trace tear film debris   Anterior Chamber deep and clear deep and clear   Iris Round and dilated Round and dilated   Lens PC IOL in good position, trace Posterior capsular opacification PC IOL in good position, 1+ Posterior capsular opacification   Anterior Vitreous Vitreous syneresis, Posterior vitreous detachment, vitreous condensations Vitreous syneresis, vitreous condensations         Fundus Exam       Right Left   Disc mild Pallor, Sharp rim, 360 PPA Trace Pallor, Sharp rim, PPA   C/D Ratio 0.2 0.2   Macula Flat, Blunted foveal reflex, RPE mottling and clumping, early atrophy Flat, Blunted foveal reflex, RPE mottling and clumping, No heme or edema   Vessels attenuated, mild tortuosity attenuated, mild tortuosity   Periphery Attached, no heme Attached, no heme           Refraction     Wearing Rx       Sphere Cylinder Axis Add   Right -1.00 +1.00 173 +2.50   Left -0.50 Sphere  +2.50           IMAGING AND PROCEDURES  Imaging and Procedures for 03/23/2023  OCT, Retina - OU - Both Eyes       Right Eye Quality was good. Central Foveal Thickness: 279. Progression has improved. Findings include normal foveal contour, no SRF, myopic contour, epiretinal membrane, intraretinal fluid (Myopic degeneration with foveoschisis and multilaminer schisis inferior mac -- slightly improved, mild ERM).   Left Eye Quality was good. Central Foveal Thickness: 244. Progression has been stable. Findings  include normal foveal contour, no IRF, no SRF.   Notes *Images captured and  stored on drive  Diagnosis / Impression:  OD: Myopic degeneration with foveoschisis and multilaminar schisis inferior mac -- slightly improved, mild ERM OS: NFP, no IRF/SRF  Clinical management:  See below  Abbreviations: NFP - Normal foveal profile. CME - cystoid macular edema. PED - pigment epithelial detachment. IRF - intraretinal fluid. SRF - subretinal fluid. EZ - ellipsoid zone. ERM - epiretinal membrane. ORA - outer retinal atrophy. ORT - outer retinal tubulation. SRHM - subretinal hyper-reflective material. IRHM - intraretinal hyper-reflective material            ASSESSMENT/PLAN:    ICD-10-CM   1. 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 Epiretinal membrane (ERM) of right eye  H35.371     3. Essential hypertension  I10     4. Hypertensive retinopathy of both eyes  H35.033     5. Pseudophakia, both eyes  Z96.1      Myopic degeneration with foveoschisis OD  - BCVA OD: 20/25 - stable  - OCT OD shows Myopic degeneration with foveoschisis and multilaminar schisis inferior mac -- slightly improved, mild ERM   - discussed findings, prognosis - no retinal or ophthalmic interventions indicated or recommended - monitor  - f/u 1 yr - DFE, OCT  2. Epiretinal membrane, right eye  - mild ERM greatest inferior macula with multilaminar schisis - BCVA 20/25 -- stable - asymptomatic, no metamorphopsia - no indication for surgery at this time - monitor for now  3,4. Hypertensive retinopathy OU - discussed importance of tight BP control - monitor  5. Pseudophakia OU  - s/p CE/IOL OU  - IOLs in good position, doing well  - monitor  6. ?anisometropic amblyopia  - pt reports OD has "always been weaker" but states she didn't start wearing glasses until her 20s or 30s   Ophthalmic Meds Ordered this visit:  No orders of the defined types were placed in this encounter.    Return in about 1 year (around  03/22/2024) for f/u myopic degeneration OD, DFE, OCT.  There are no Patient Instructions on file for this visit.  This document serves as a record of services personally performed by Karie Chimera, MD, PhD. It was created on their behalf by Glee Arvin. Manson Passey, OA an ophthalmic technician. The creation of this record is the provider's dictation and/or activities during the visit.    Electronically signed by: Glee Arvin. Kristopher Oppenheim 03/23/23 12:33 PM  Karie Chimera, M.D., Ph.D. Diseases & Surgery of the Retina and Vitreous [facility] 03/23/2023   I have reviewed the above documentation for accuracy and completeness, and I agree with the above. Karie Chimera, M.D., Ph.D. 03/23/23 12:35 PM   Abbreviations: M myopia (nearsighted); A astigmatism; H hyperopia (farsighted); P presbyopia; Mrx spectacle prescription;  CTL contact lenses; OD right eye; OS left eye; OU both eyes  XT exotropia; ET esotropia; PEK punctate epithelial keratitis; PEE punctate epithelial erosions; DES dry eye syndrome; MGD meibomian gland dysfunction; ATs artificial tears; PFAT's preservative free artificial tears; NSC nuclear sclerotic cataract; PSC posterior subcapsular cataract; ERM epi-retinal membrane; PVD posterior vitreous detachment; RD retinal detachment; DM diabetes mellitus; DR diabetic retinopathy; NPDR non-proliferative diabetic retinopathy; PDR proliferative diabetic retinopathy; CSME clinically significant macular edema; DME diabetic macular edema; dbh dot blot hemorrhages; CWS cotton wool spot; POAG primary open  angle glaucoma; C/D cup-to-disc ratio; HVF humphrey visual field; GVF goldmann visual field; OCT optical coherence tomography; IOP intraocular pressure; BRVO Branch retinal vein occlusion; CRVO central retinal vein occlusion; CRAO central retinal artery occlusion; BRAO branch retinal artery occlusion; RT retinal tear; SB scleral buckle; PPV pars plana vitrectomy; VH Vitreous hemorrhage;  PRP panretinal laser photocoagulation; IVK intravitreal kenalog; VMT vitreomacular traction; MH Macular hole;  NVD neovascularization of the disc; NVE neovascularization elsewhere; AREDS age related eye disease study; ARMD age related macular degeneration; POAG primary open angle glaucoma; EBMD epithelial/anterior basement membrane dystrophy; ACIOL anterior chamber intraocular lens; IOL intraocular lens; PCIOL posterior chamber intraocular lens; Phaco/IOL phacoemulsification with intraocular lens placement; PRK photorefractive keratectomy; LASIK laser assisted in situ keratomileusis; HTN hypertension; DM diabetes mellitus; COPD chronic obstructive pulmonary disease

## 2023-03-23 ENCOUNTER — Ambulatory Visit (INDEPENDENT_AMBULATORY_CARE_PROVIDER_SITE_OTHER): Payer: Medicare Other | Admitting: Ophthalmology

## 2023-03-23 ENCOUNTER — Encounter (INDEPENDENT_AMBULATORY_CARE_PROVIDER_SITE_OTHER): Payer: Self-pay | Admitting: Ophthalmology

## 2023-03-23 DIAGNOSIS — I10 Essential (primary) hypertension: Secondary | ICD-10-CM

## 2023-03-23 DIAGNOSIS — H35371 Puckering of macula, right eye: Secondary | ICD-10-CM

## 2023-03-23 DIAGNOSIS — H442D1 Degenerative myopia with foveoschisis, right eye: Secondary | ICD-10-CM

## 2023-03-23 DIAGNOSIS — Z961 Presence of intraocular lens: Secondary | ICD-10-CM

## 2023-03-23 DIAGNOSIS — H35033 Hypertensive retinopathy, bilateral: Secondary | ICD-10-CM

## 2023-03-25 ENCOUNTER — Ambulatory Visit: Payer: Medicare Other | Admitting: Student

## 2023-03-25 VITALS — BP 107/85 | HR 86 | Ht 61.0 in | Wt 142.8 lb

## 2023-03-25 DIAGNOSIS — R739 Hyperglycemia, unspecified: Secondary | ICD-10-CM

## 2023-03-25 DIAGNOSIS — R42 Dizziness and giddiness: Secondary | ICD-10-CM | POA: Diagnosis not present

## 2023-03-25 LAB — GLUCOSE, POCT (MANUAL RESULT ENTRY): POC Glucose: 100 mg/dL — AB (ref 70–99)

## 2023-03-25 MED ORDER — MECLIZINE HCL 12.5 MG PO TABS: 12.5000 mg | ORAL_TABLET | Freq: Three times a day (TID) | ORAL | 0 refills | Status: AC | PRN

## 2023-03-25 NOTE — Assessment & Plan Note (Signed)
Clinical history suggestive of vertigo.

## 2023-03-25 NOTE — Patient Instructions (Addendum)
It was great to see you today! Thank you for choosing [facility] for your primary care.  Today we addressed: We will check your glucose today however I do not suspect this is related to your room spinning sensation This is a recurrence of your vertigo.  I do recommend that if these episodes become more frequent that you try vestibular therapy.  However, in the meantime I will supply meclizine which you can take as needed up to 3 times in a day Another beneficial thing is the Epley maneuver which you may try at home.  I have attached a link for a YouTube video. [email or web address]  If you haven't already, sign up for My Chart to have easy access to your labs results, and communication with your primary care physician.  Return if symptoms worsen or fail to improve. Please arrive 15 minutes before your appointment to ensure smooth check in process.  We appreciate your efforts in making this happen.  Thank you for allowing me to participate in your care, [name], DO 03/25/2023, 1:49 PM PGY-3, [facility]

## 2023-03-25 NOTE — Progress Notes (Signed)
  SUBJECTIVE:   CHIEF COMPLAINT / HPI:   Patient states that on Monday she had an episode of approximately 2 hours of room spinning sensation.  She has history of vertigo in the past states she gets episodes like this 1-2 times per year.  She has never tried therapy nor medication management.  She denies any further infectious symptoms such as fever, productive cough or nasal drainage.  States that these episodes are strictly room spinning, she does not have accompanying nausea or vomiting.  They resolve after a couple hours of lying down.  PERTINENT  PMH / PSH: HTN, HLD, prediabetes  OBJECTIVE:  BP 107/85   Pulse 86   Ht 5\' 1"  (1.549 m)   Wt 142 lb 12.8 oz (64.8 kg)   LMP  (LMP Unknown)   SpO2 98%   BMI 26.98 kg/m  General: Well-appearing, NAD HEENT: Normal TMs, clear oropharynx, EOMI, PERRL CV: RRR, no murmurs auscultated Pulm: CTAB, normal WOB, dry cough Neuro: Conversationally appropriate without focal neurological deficits  ASSESSMENT/PLAN:   Assessment & Plan Vertigo Clinical history suggestive of vertigo. Hyperglycemia Check per request of patient.  I do not suspect her vertiginous episodes are related to abnormal glycemic ranges. Return if symptoms worsen or fail to improve. [name], DO 03/25/2023, 1:49 PM PGY-3, [facility]

## 2023-04-17 ENCOUNTER — Other Ambulatory Visit: Payer: Self-pay | Admitting: Student

## 2023-04-25 ENCOUNTER — Other Ambulatory Visit: Payer: Self-pay | Admitting: Student

## 2023-04-25 DIAGNOSIS — E785 Hyperlipidemia, unspecified: Secondary | ICD-10-CM

## 2023-05-30 IMAGING — MR MR HEAD W/O CM
6 of 10 series · 27 of 48 positions shown · non-contrast
Comparison: CT from earlier the same day.

CLINICAL DATA: Initial evaluation for acute hypertensive emergency.

EXAM:
MRI HEAD WITHOUT CONTRAST
TECHNIQUE: Multiplanar, multiecho pulse sequences of the brain and surrounding
structures were obtained without intravenous contrast.

[Series 2: DWI · axial · 3.0mm · 0.94mm/px · z∈[-130,+9]mm · 8 of 100 slices shown (1 of 2)]
[im 1/100]
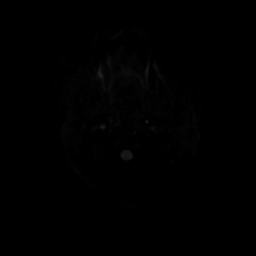
[im 12/100]
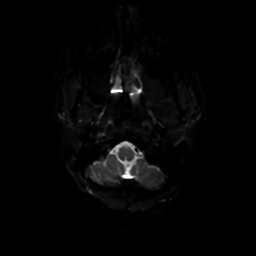
[im 34/100]
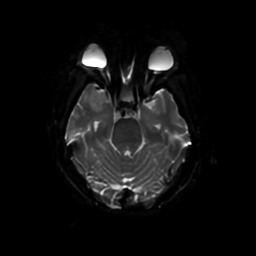
[im 45/100]
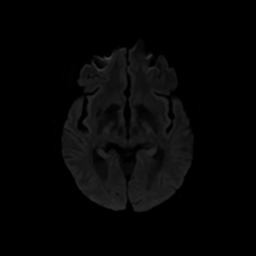
[im 56/100]
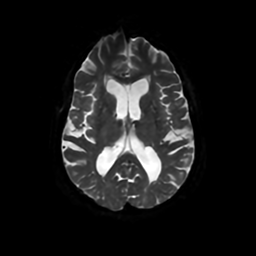
[im 67/100]
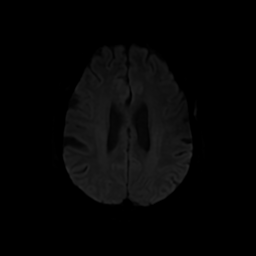
[im 89/100]
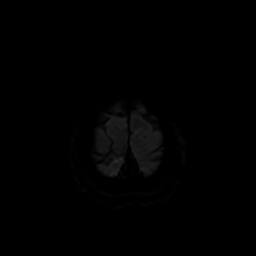
[im 100/100]
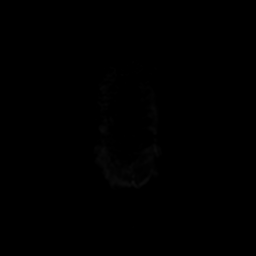

[Series 3: DWI · coronal · 4.0mm · 0.94mm/px · 7 of 76 slices shown (2 of 2)]
[im 1/76]
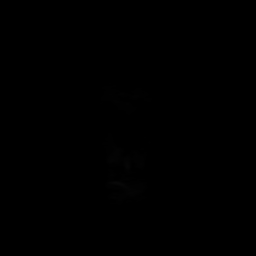
[im 13/76]
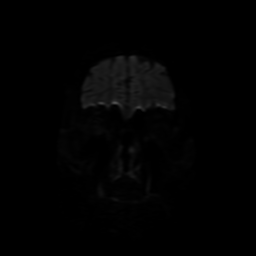
[im 26/76]
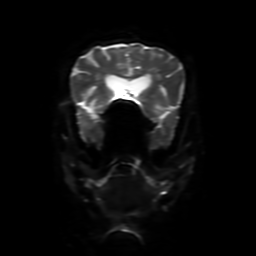
[im 38/76]
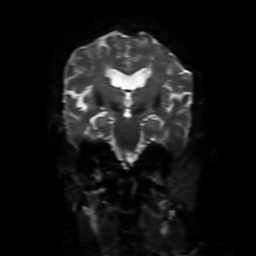
[im 51/76]
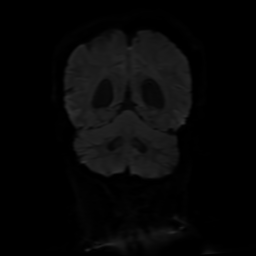
[im 63/76]
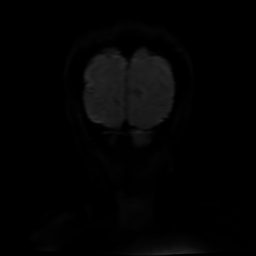
[im 76/76]
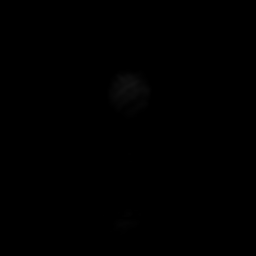

[Series 4: FLAIR · sagittal · 5.0mm · 0.23mm/px · 2 of 25 slices shown (1 of 2)]
[im 1/25]
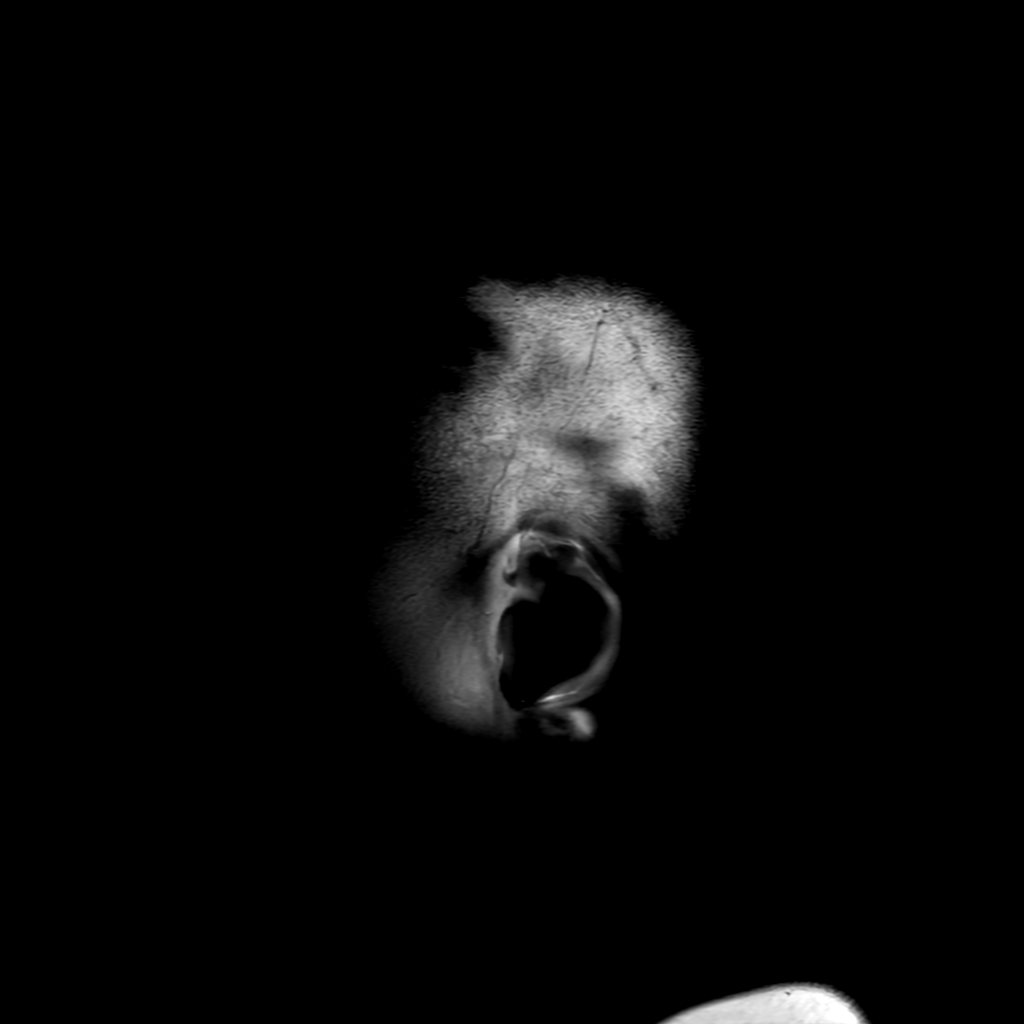
[im 25/25]
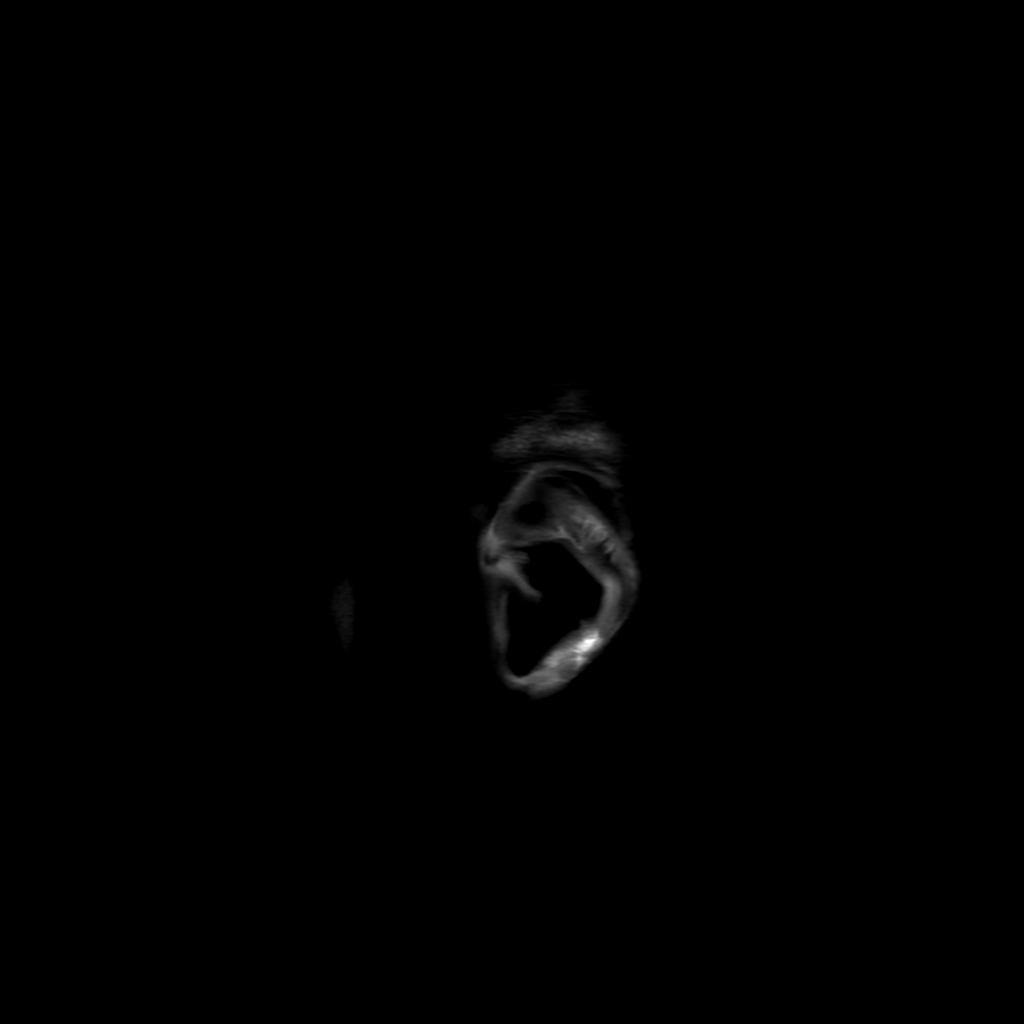

[Series 6: FLAIR · axial · 4.0mm · 0.45mm/px · z∈[-130,+12]mm · 3 of 35 slices shown (2 of 2)]
[im 1/35]
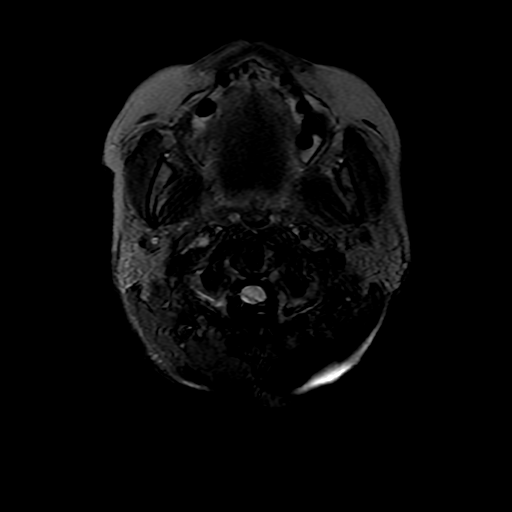
[im 18/35]
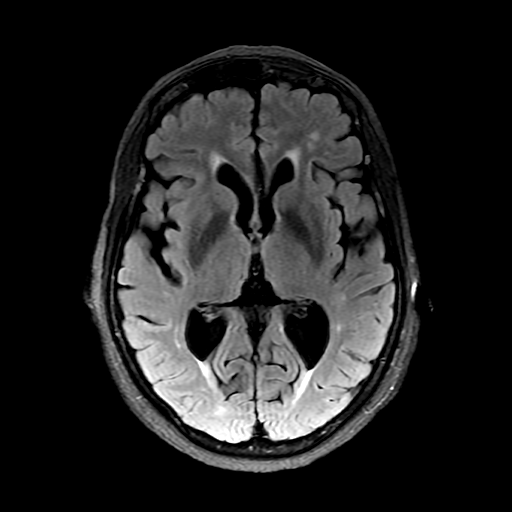
[im 35/35]
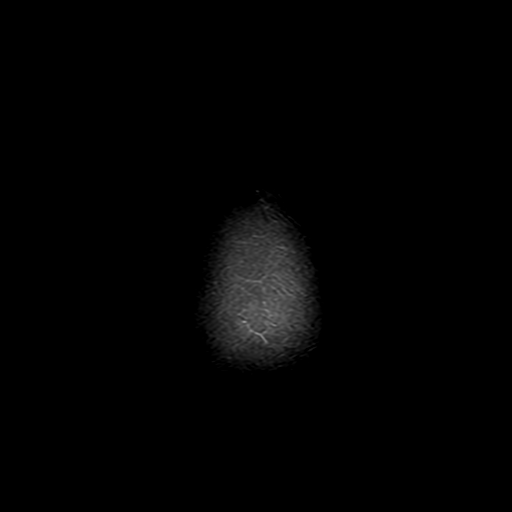

[Series 250: ADC · axial · 3.0mm · 0.94mm/px · z∈[-130,+9]mm · 4 of 50 slices shown (1 of 2)]
[im 1/50]
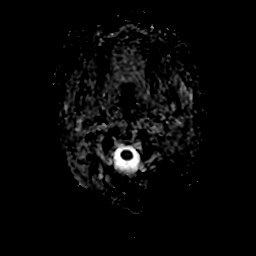
[im 17/50]
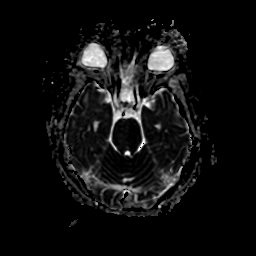
[im 33/50]
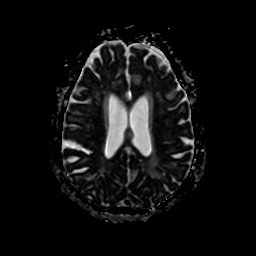
[im 50/50]
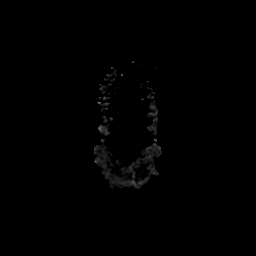

[Series 350: ADC · coronal · 4.0mm · 0.94mm/px · 3 of 38 slices shown (2 of 2)]
[im 1/38]
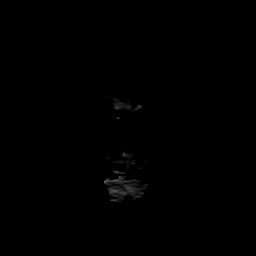
[im 19/38]
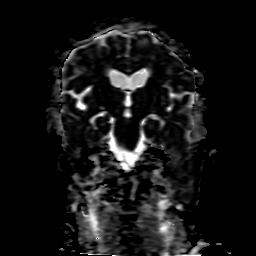
[im 38/38]
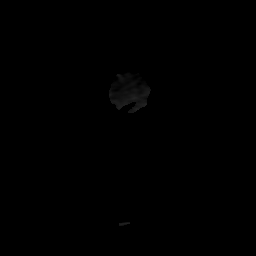

[27 of 48 positions shown; findings below may reference images not displayed]

FINDINGS: Brain: Age-related cerebral atrophy with mild-to-moderate chronic
microvascular ischemic disease. No evidence for acute or subacute
infarct. No areas of chronic cortical infarction. No acute
intracranial hemorrhage. Single small chronic microhemorrhage noted
within the right parietal lobe.

No mass lesion, midline shift or mass effect. No hydrocephalus or
extra-axial fluid collection. No changes of PRES. Pituitary gland
suprasellar region within normal limits.

Vascular: Major intracranial vascular flow voids are maintained.

Skull and upper cervical spine: Craniocervical junction normal. Bone
marrow signal intensity within normal limits. No scalp soft tissue
abnormality.

Sinuses/Orbits: Prior ocular lens replacement with asymmetric right
axial myopia. Scattered mucosal thickening about the ethmoidal air
cells. No significant mastoid effusion.

Other: None.
IMPRESSION: 1. No acute intracranial abnormality.
2. Age-related cerebral atrophy with mild-to-moderate chronic
microvascular ischemic disease.

## 2023-05-30 IMAGING — CT CT HEAD W/O CM
4 series · 16 of 47 positions shown, 18 images · non-contrast
Comparison: None Available.

CLINICAL DATA: Dizziness.  Headache.



[Series 3: head wo · axial · 0.40mm/px · z∈[-110,+4]mm · 7 of 31 slices shown, 9 images]
[im 4/31  brain]
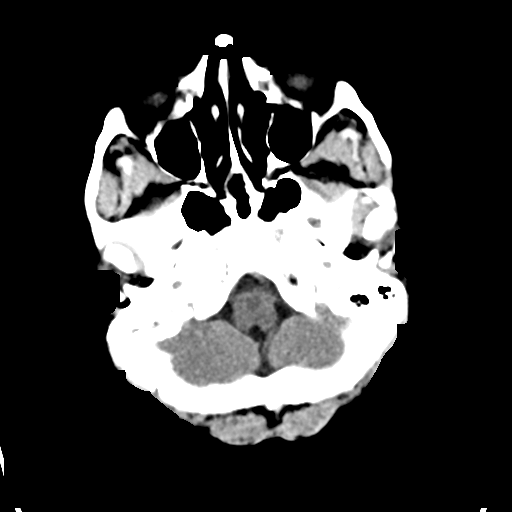
[im 4/31  bone]
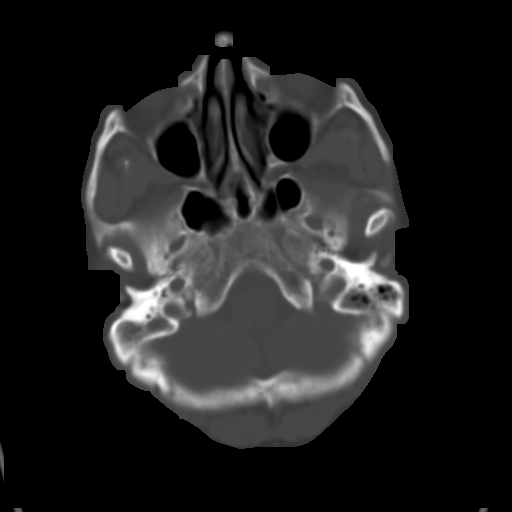
[im 8/31  brain]
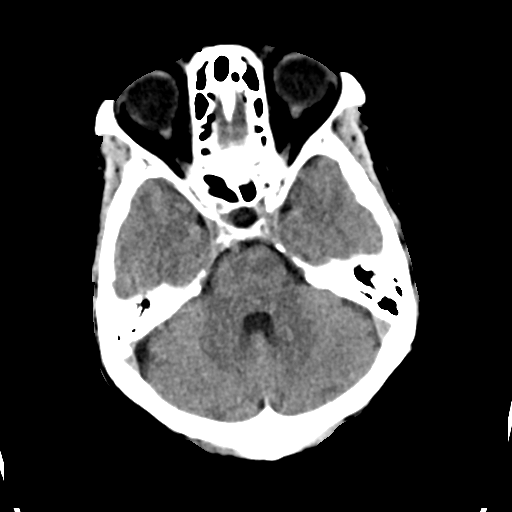
[im 12/31  brain]
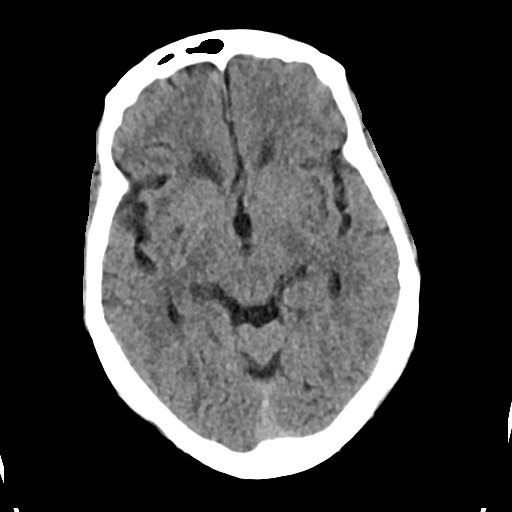
[im 16/31  brain]
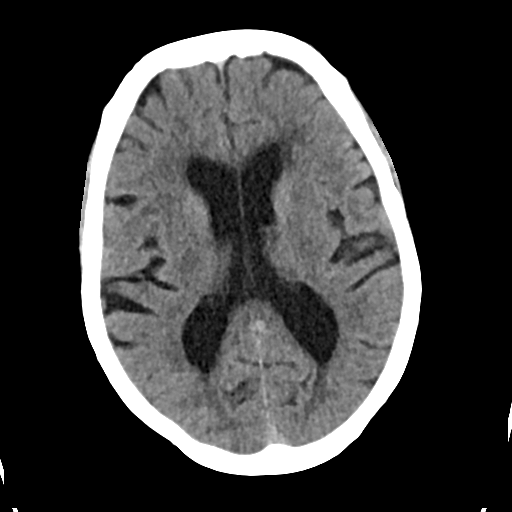
[im 19/31  brain]
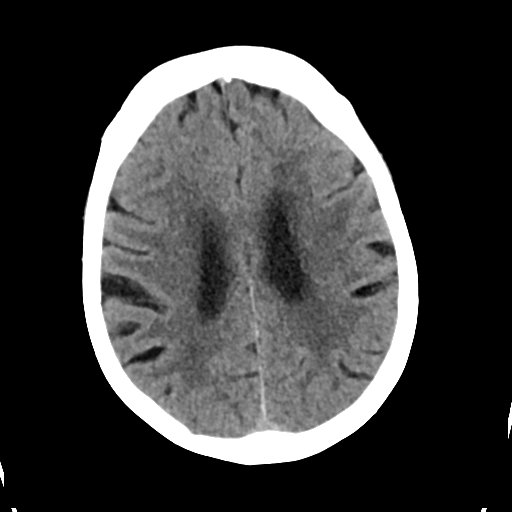
[im 19/31  bone]
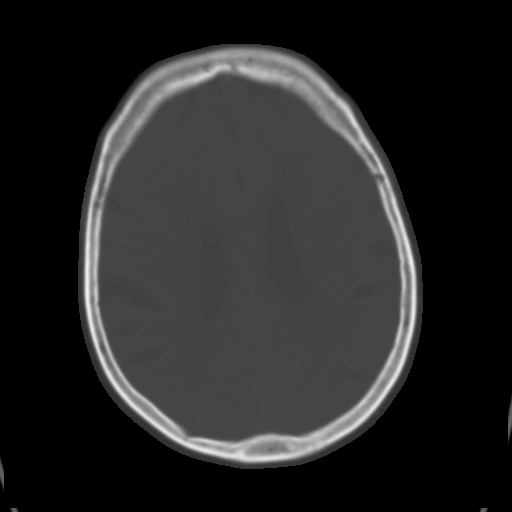
[im 23/31  brain]
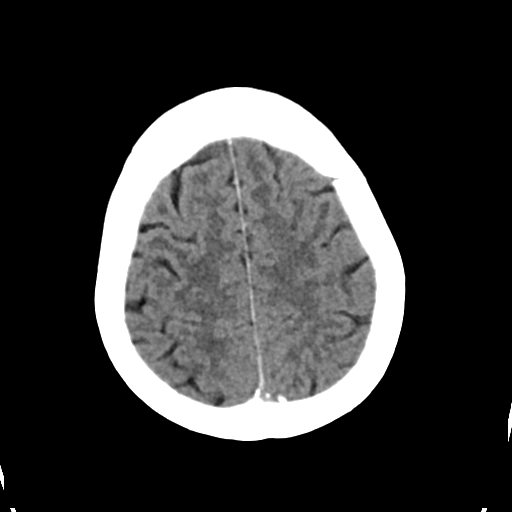
[im 27/31  brain]
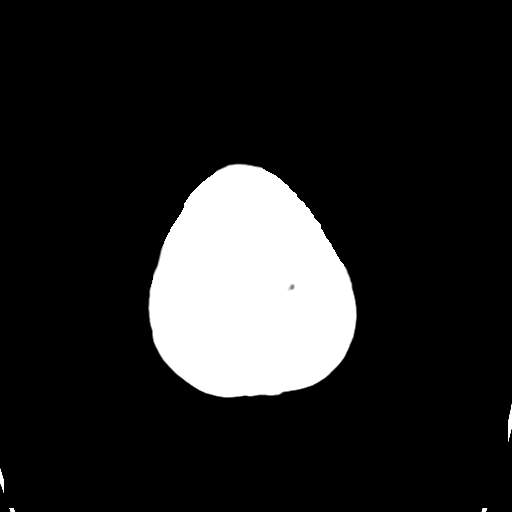

[Series 4: head bone · axial · 0.40mm/px · z∈[-112,-80]mm · 3 of 78 slices shown]
[im 8/78  bone]
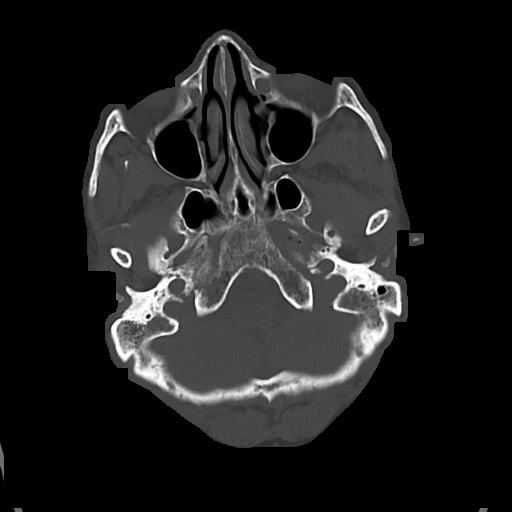
[im 16/78  bone]
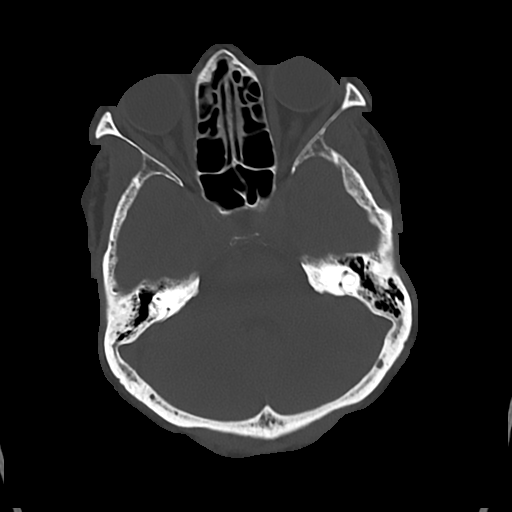
[im 24/78  bone]
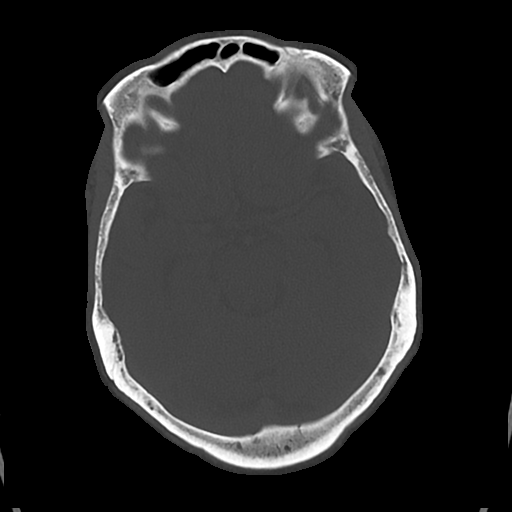

[Series 5: cor soft · coronal · 0.29mm/px · 3 of 66 slices shown]
[im 22/66  brain]
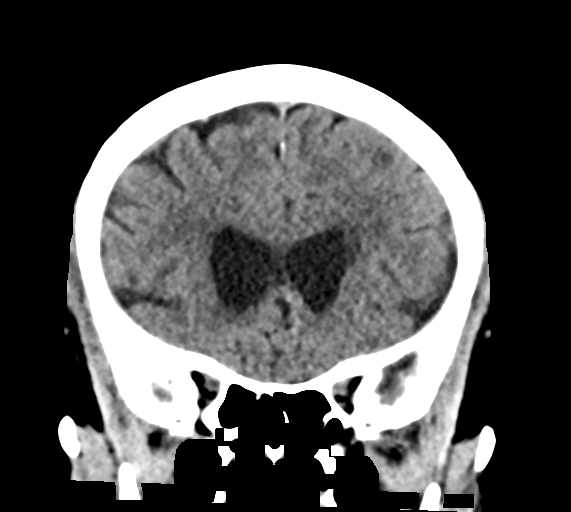
[im 29/66  brain]
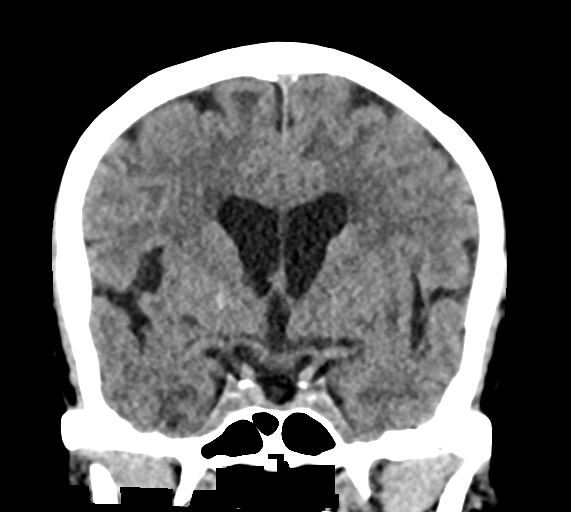
[im 37/66  brain]
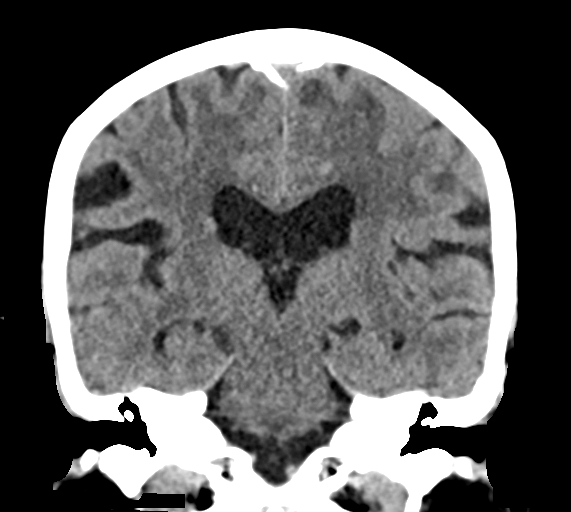

[Series 6: sag soft · sagittal · 0.29mm/px · 3 of 54 slices shown]
[im 18/54  brain]
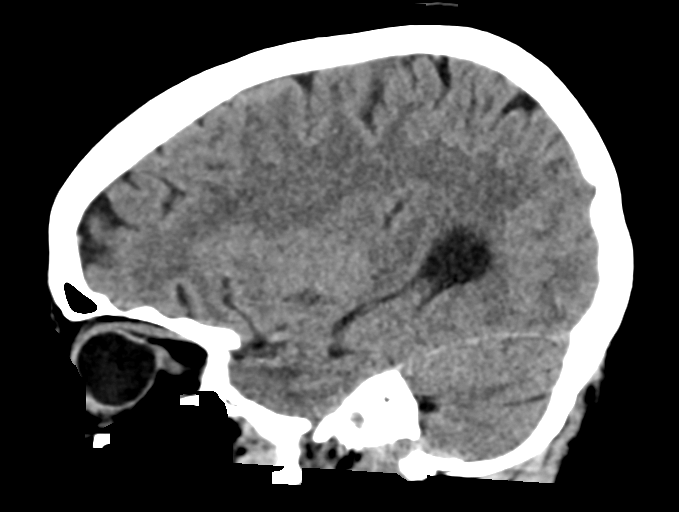
[im 27/54  brain]
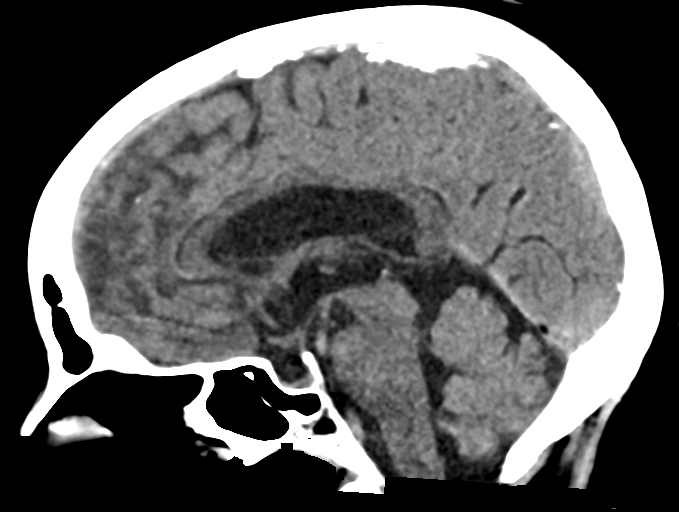
[im 36/54  brain]
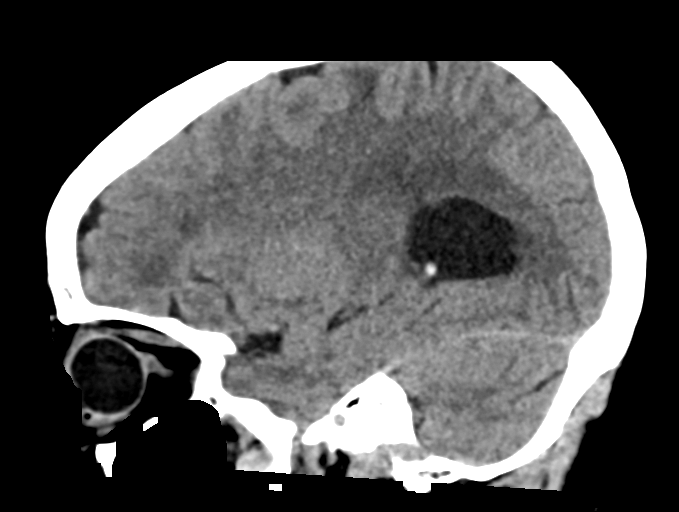

[16 of 47 positions shown; findings below may reference images not displayed]

FINDINGS: Brain: No evidence of acute infarction, hemorrhage, hydrocephalus,
extra-axial collection or mass lesion/mass effect.

Vascular: No hyperdense vessel or unexpected calcification.

Skull: Normal. Negative for fracture or focal lesion.

Sinuses/Orbits: No acute finding.

Other: None.
IMPRESSION: No acute intracranial abnormality seen.

## 2023-06-10 ENCOUNTER — Other Ambulatory Visit: Payer: Self-pay | Admitting: Student

## 2023-06-10 ENCOUNTER — Encounter: Payer: Self-pay | Admitting: Student

## 2023-06-10 ENCOUNTER — Ambulatory Visit (INDEPENDENT_AMBULATORY_CARE_PROVIDER_SITE_OTHER): Admitting: Student

## 2023-06-10 ENCOUNTER — Ambulatory Visit
Admission: RE | Admit: 2023-06-10 | Discharge: 2023-06-10 | Disposition: A | Discharge status: Home / Self Care | Source: Ambulatory Visit | Attending: Family Medicine | Admitting: Family Medicine

## 2023-06-10 DIAGNOSIS — Z0000 Encounter for general adult medical examination without abnormal findings: Secondary | ICD-10-CM | POA: Diagnosis not present

## 2023-06-10 DIAGNOSIS — M542 Cervicalgia: Secondary | ICD-10-CM | POA: Diagnosis not present

## 2023-06-10 DIAGNOSIS — M549 Dorsalgia, unspecified: Secondary | ICD-10-CM | POA: Diagnosis not present

## 2023-06-10 MED ORDER — TIZANIDINE HCL 4 MG PO CAPS: 4.0000 mg | ORAL_CAPSULE | Freq: Every evening | ORAL | 0 refills | Status: DC | PRN

## 2023-06-10 NOTE — Patient Instructions (Addendum)
 It was great to see you today! Thank you for choosing [facility] for your primary care.  Today we addressed: Lets get some imaging.  You may use Tylenol 1000 mg every 6 hours as needed and ibuprofen  600 mg every 8 hours as needed.  I prescribe Zanaflex to use nightly if needed.  I have placed an order for xrays.  Please go to [facility] at [employer] to have this completed.  You do not need an appointment, but if you would like to call them beforehand, their number is [phone].  We will contact you with your results afterwards.   If you haven't already, sign up for My Chart to have easy access to your labs results, and communication with your primary care physician.  Return if symptoms worsen or fail to improve. Please arrive 15 minutes before your appointment to ensure smooth check in process.  We appreciate your efforts in making this happen.  Thank you for allowing me to participate in your care, Veronia Goon, DO 06/10/2023, 10:29 AM PGY-3, [facility]
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
## 2023-06-30 ENCOUNTER — Ambulatory Visit (INDEPENDENT_AMBULATORY_CARE_PROVIDER_SITE_OTHER): Admitting: Family Medicine

## 2023-06-30 VITALS — BP 150/77 | HR 62 | Ht 61.0 in | Wt 143.2 lb

## 2023-06-30 DIAGNOSIS — I10 Essential (primary) hypertension: Secondary | ICD-10-CM | POA: Diagnosis not present

## 2023-06-30 DIAGNOSIS — L989 Disorder of the skin and subcutaneous tissue, unspecified: Secondary | ICD-10-CM

## 2023-06-30 DIAGNOSIS — B0239 Other herpes zoster eye disease: Secondary | ICD-10-CM | POA: Diagnosis not present

## 2023-06-30 DIAGNOSIS — H35371 Puckering of macula, right eye: Secondary | ICD-10-CM | POA: Diagnosis not present

## 2023-06-30 MED ORDER — VALACYCLOVIR HCL 1 G PO TABS
1000.0000 mg | ORAL_TABLET | Freq: Three times a day (TID) | ORAL | 0 refills | Status: AC
Start: 2023-06-30 — End: 2023-07-10

## 2023-06-30 NOTE — Assessment & Plan Note (Signed)
 Uncontrolled today.  Pain likely contributing factor.  Recheck when not in acute illness.

## 2023-06-30 NOTE — Progress Notes (Signed)
    SUBJECTIVE:   CHIEF COMPLAINT / HPI:   Facial lesion Been present for the last couple of days.  She has been putting hydrocortisone  ointment on it.  He has been very itchy.  Is also very painful to even light touch.  It is only on the right side of her face.  No other rash other places.  No fevers or systemic symptoms.  She was in the garden before this happened, though she was unaware if she got into any poison ivy.  Nobody around her with similar symptoms.  This is never happened before.  PERTINENT  PMH / PSH: Hypertension, prediabetes, HLD, depression  OBJECTIVE:   BP (!) 150/77   Pulse 62   Ht 5\' 1"  (1.549 m)   Wt 143 lb 3.2 oz (65 kg)   LMP  (LMP Unknown)   SpO2 100%   BMI 27.06 kg/m   General: Alert and oriented, in NAD Skin: Warm, dry, and intact; confluence of erythema on the right cheek with minimal erythema along the right side of the chin and right side of the forehead; vesicular lesions clustered linearly most about the right oral commissure with sparse lesions at the right side of the chin and forehead, lesions markedly tender to light touch HEENT: NCAT, EOM grossly normal, midline nasal septum Respiratory: Breathing and speaking comfortably on RA Extremities: Moves all extremities grossly equally Neurological: No gross focal deficit Psychiatric: Appropriate mood and affect    ASSESSMENT/PLAN:   Assessment & Plan Facial lesion Differential includes shingles (she has not been vaccinated, lesions appear vesicular, and lesions are very painful/burning to the touch), poison ivy (she was work in the garden, lesions are vesicular, initially described as itchy and partially responsive to cortisone), and erysipelas (red confluence with tenderness though lack of systemic symptoms would be less likely).  Reassuringly no ocular involvement at this time.  Will treat empirically for shingles with valacyclovir 1 g 3 times daily for 10 days.  Also advise she contact her  optometrist at [facility] today for close follow-up given risk to ocular structures.  Advised to return tomorrow for check-in after taking Valtrex.  If worsening or not improved, could consider stronger topical or oral steroids.  Advised to present to the emergency room if worsening overnight or any ocular involvement before tomorrow.  Patient understanding. Primary hypertension Uncontrolled today.  Pain likely contributing factor.  Recheck when not in acute illness.   Genetta Kenning, MD [facility]

## 2023-06-30 NOTE — Patient Instructions (Addendum)
 Your facial lesion could be shingles. I will send in a medication to target the virus called Valtrex. Take this today. Come back tomorrow to see how you are doing. If you worsen before then, or have any issues with your vision, please be sure to go to the emergency room. I recommend calling [facility] so they can also follow up, as shingles can affect the eyes.

## 2023-07-01 ENCOUNTER — Ambulatory Visit (INDEPENDENT_AMBULATORY_CARE_PROVIDER_SITE_OTHER): Admitting: Family Medicine

## 2023-07-01 VITALS — BP 132/77 | HR 73 | Ht 61.0 in | Wt 146.2 lb

## 2023-07-01 DIAGNOSIS — B029 Zoster without complications: Secondary | ICD-10-CM | POA: Diagnosis not present

## 2023-07-01 NOTE — Assessment & Plan Note (Addendum)
 Likely cause of pain given improvement s/p valacyclovir.  No ophthalmic complications according to her eye doctor.  She will follow-up with them in 2 weeks.  Continue valacyclovir course.  She will let us  know how she is doing when she finishes the Valtrex course or sooner if worsening before then.

## 2023-07-01 NOTE — Patient Instructions (Signed)
 I am happy to see that your face is improving. Continue the medication Valtrex. Send us  a message once you finish to keep us  updated. Go to the ED if you have any problems with your eye sight or this worsens suddenly.

## 2023-07-01 NOTE — Progress Notes (Signed)
    SUBJECTIVE:   CHIEF COMPLAINT / HPI:   Facial lesion Previously seen by me yesterday, 06/30/2023.  Differential included shingles, poison ivy/oak, and erysipelas/cellulitis.  She was given valacyclovir 1 g 3 times daily for 10 days and advised to contact her optometrist for close ophthalmic follow-up.  PERTINENT  PMH / PSH: Hypertension, prediabetes, HLD, depression  OBJECTIVE:   BP 132/77   Pulse 73   Ht 5\' 1"  (1.549 m)   Wt 146 lb 3.2 oz (66.3 kg)   LMP  (LMP Unknown)   SpO2 96%   BMI 27.62 kg/m   General: Alert and oriented, in NAD Skin: Warm, dry, and intact; improved erythema over right side of face with persistent mild vesicular lesions when compared to yesterday's exam HEENT: NCAT, EOM grossly normal, midline nasal septum Respiratory: Breathing and speaking comfortably on RA Extremities: Moves all extremities grossly equally Neurological: No gross focal deficit Psychiatric: Appropriate mood and affect  ASSESSMENT/PLAN:   Assessment & Plan Herpes zoster without complication Likely cause of pain given improvement s/p valacyclovir.  No ophthalmic complications according to her eye doctor.  She will follow-up with them in 2 weeks.  Continue valacyclovir course.  She will let us  know how she is doing when she finishes the Valtrex course or sooner if worsening before then.   Stacy Kenning, MD [facility]

## 2023-07-04 ENCOUNTER — Other Ambulatory Visit: Payer: Self-pay | Admitting: Student

## 2023-07-14 DIAGNOSIS — B0239 Other herpes zoster eye disease: Secondary | ICD-10-CM | POA: Diagnosis not present

## 2023-07-14 DIAGNOSIS — H35371 Puckering of macula, right eye: Secondary | ICD-10-CM | POA: Diagnosis not present

## 2023-07-19 ENCOUNTER — Encounter: Payer: Self-pay | Admitting: *Deleted

## 2023-08-06 ENCOUNTER — Other Ambulatory Visit: Payer: Self-pay | Admitting: Student

## 2023-08-06 DIAGNOSIS — I10 Essential (primary) hypertension: Secondary | ICD-10-CM

## 2023-08-17 ENCOUNTER — Telehealth: Payer: Self-pay

## 2023-08-17 DIAGNOSIS — M1990 Unspecified osteoarthritis, unspecified site: Secondary | ICD-10-CM

## 2023-08-17 NOTE — Telephone Encounter (Signed)
 Patient calls nurse line requesting refill on diclofenac  sodium gel refill.   Preferred pharmacy- [facility] [address].   This is not on current medication list. Patient previously receiving medication for arthritis pain management.   Please advise if refill can be sent in. Thanks.   Chiquita JAYSON English, RN

## 2023-08-18 MED ORDER — DICLOFENAC SODIUM 1 % EX GEL: 2.0000 g | Freq: Four times a day (QID) | CUTANEOUS | 1 refills | Status: AC | PRN

## 2023-08-18 NOTE — Telephone Encounter (Signed)
 Refilled Voltaren  for arthritis at pt request. Would recommend in-person evaluation if arthritis is worsening.

## 2023-09-05 ENCOUNTER — Other Ambulatory Visit: Payer: Self-pay

## 2023-09-05 DIAGNOSIS — I10 Essential (primary) hypertension: Secondary | ICD-10-CM

## 2023-09-05 MED ORDER — LOSARTAN POTASSIUM 50 MG PO TABS: ORAL_TABLET | ORAL | 3 refills | Status: AC

## 2023-11-08 ENCOUNTER — Ambulatory Visit

## 2023-11-08 VITALS — BP 148/79 | HR 65 | Ht 60.0 in | Wt 143.0 lb

## 2023-11-08 DIAGNOSIS — R252 Cramp and spasm: Secondary | ICD-10-CM

## 2023-11-08 DIAGNOSIS — R011 Cardiac murmur, unspecified: Secondary | ICD-10-CM | POA: Diagnosis not present

## 2023-11-08 DIAGNOSIS — E785 Hyperlipidemia, unspecified: Secondary | ICD-10-CM

## 2023-11-08 DIAGNOSIS — I10 Essential (primary) hypertension: Secondary | ICD-10-CM

## 2023-11-08 DIAGNOSIS — R7303 Prediabetes: Secondary | ICD-10-CM

## 2023-11-08 DIAGNOSIS — R413 Other amnesia: Secondary | ICD-10-CM | POA: Diagnosis not present

## 2023-11-08 LAB — POCT GLYCOSYLATED HEMOGLOBIN (HGB A1C): HbA1c, POC (prediabetic range): 6.1 % (ref 5.7–6.4)

## 2023-11-08 NOTE — Progress Notes (Signed)
    SUBJECTIVE:   CHIEF COMPLAINT / HPI:   Stacy Nguyen is a 68 y.o. female presenting for leg cramping ongoing for several months now acutely worsening.  Happens every other day  Cramping occurs in the entire leg in the big muscle groups  No pain with ambulation Does not appear to happen with activity   I reviewed previous lab results. Previously normal kidney function and electrolytes. A1c 6.1   PERTINENT  PMH / PSH: reviewed and updated.  OBJECTIVE:   BP (!) 148/79   Pulse 65   Ht 5' (1.524 m)   Wt 143 lb (64.9 kg)   LMP  (LMP Unknown)   SpO2 100%   BMI 27.93 kg/m   Well-appearing, no acute distress Cardio: Regular rate, regular rhythm, no murmurs on exam. Pulm: Clear, no wheezing, no crackles. No increased work of breathing Abdominal: bowel sounds present, soft, non-tender, non-distended Extremities: no peripheral edema  Neuro: alert and oriented x3, speech normal in content, no facial asymmetry, strength intact and equal bilaterally in UE and LE, pupils equal and reactive to light.  Psych:  Cognition and judgment appear intact. Alert, communicative  and cooperative with normal attention span and concentration. No apparent delusions, illusions, hallucinations    ASSESSMENT/PLAN:   Assessment & Plan Leg cramping Concerning for electrolyte derangement. Do not suspect claudication aspect at this time.  Check CMP, Mag  Discussed initiating muscle relaxer, patient declined at this time.  Encouraged hydration at home  Can try Mag and Vit K over the counter if no improvement or electrolytes are normal  Primary hypertension Blood pressure not at goal today. On prior BP review, has had a few elevated readings in the office  Check kidney function and electrolytes  Consider additional medication hydrochlorothiazide 12.5 mg at next visit or referral for ambulatory 24 h BP monitoring  Hyperlipidemia, unspecified hyperlipidemia type Recheck lipid panel  Prediabetes Recheck  A1c: 6.1 today  Systolic murmur Auscultated on exam 3/6 systolic Ordering echo to r/o valve disorder  Patient is asymptomatic  Memory loss Brought up at the end of visit. Patient concerned due to strong family history of dad and younger brother with severe dementia.  Patient will need to be rescheduled for formal memory evaluation  She will bring a friend with her to aide with HPI Obtain B12, RPR and HIV today with blood work     Damien Pinal, DO [facility]

## 2023-11-08 NOTE — Patient Instructions (Addendum)
 It was great to see you today!   I have ordered an ultrasound of your heart. [facility] will call you to schedule this.   [facility] [address] [place],  [place]  [address]  I have ordered blood work today. I will send you a message through MyChart or send you a letter with your results. If there is an abnormal result, I will give you a call.    Future Appointments  Date Time Provider Department Center  01/30/2024  9:50 AM [facility] ANNUAL [name] VISIT [facility]  03/26/2024  9:00 AM Valdemar Rogue, MD [facility] None    Please arrive 15 minutes before your appointment to ensure smooth check in process.  If you are more than 15 minutes late, you may be asked to reschedule.   Please bring a list of your medications with you to all appointments.   Please call the clinic at [phone] if your symptoms worsen or you have any concerns.  Thank you for allowing me to participate in your care, Dr. Damien Pinal [facility]

## 2023-11-08 NOTE — Assessment & Plan Note (Addendum)
 Blood pressure not at goal today. On prior BP review, has had a few elevated readings in the office  Check kidney function and electrolytes  Consider additional medication hydrochlorothiazide 12.5 mg at next visit or referral for ambulatory 24 h BP monitoring

## 2023-11-08 NOTE — Assessment & Plan Note (Addendum)
 Recheck A1c: 6.1 today

## 2023-11-08 NOTE — Assessment & Plan Note (Signed)
 Recheck lipid panel

## 2023-11-09 ENCOUNTER — Ambulatory Visit: Payer: Self-pay | Admitting: Student

## 2023-11-09 LAB — COMPREHENSIVE METABOLIC PANEL WITH GFR
ALT: 14 IU/L (ref 0–32)
AST: 17 IU/L (ref 0–40)
Albumin: 4.4 g/dL (ref 3.9–4.9)
Alkaline Phosphatase: 88 IU/L (ref 49–135)
BUN/Creatinine Ratio: 12 (ref 12–28)
BUN: 11 mg/dL (ref 8–27)
Bilirubin Total: 0.4 mg/dL (ref 0.0–1.2)
CO2: 23 mmol/L (ref 20–29)
Calcium: 9.5 mg/dL (ref 8.7–10.3)
Chloride: 107 mmol/L — ABNORMAL HIGH (ref 96–106)
Creatinine, Ser: 0.94 mg/dL (ref 0.57–1.00)
Globulin, Total: 2.2 g/dL (ref 1.5–4.5)
Glucose: 108 mg/dL — ABNORMAL HIGH (ref 70–99)
Potassium: 3.8 mmol/L (ref 3.5–5.2)
Sodium: 144 mmol/L (ref 134–144)
Total Protein: 6.6 g/dL (ref 6.0–8.5)
eGFR: 66 mL/min/1.73 (ref >59)

## 2023-11-09 LAB — LIPID PANEL
Chol/HDL Ratio: 2.5 ratio (ref 0.0–4.4)
Cholesterol, Total: 135 mg/dL (ref 100–199)
HDL: 53 mg/dL (ref >39)
LDL Chol Calc (NIH): 74 mg/dL (ref 0–99)
Triglycerides: 31 mg/dL (ref 0–149)
VLDL Cholesterol Cal: 8 mg/dL (ref 5–40)

## 2023-11-09 LAB — HIV ANTIBODY (ROUTINE TESTING W REFLEX): HIV Screen 4th Generation wRfx: NONREACTIVE

## 2023-11-09 LAB — RPR: RPR Ser Ql: NONREACTIVE

## 2023-11-09 LAB — MAGNESIUM: Magnesium: 2.2 mg/dL (ref 1.6–2.3)

## 2023-11-09 LAB — VITAMIN B12: Vitamin B-12: 520 pg/mL (ref 232–1245)

## 2023-12-01 ENCOUNTER — Ambulatory Visit (HOSPITAL_COMMUNITY)
Admission: RE | Admit: 2023-12-01 | Discharge: 2023-12-01 | Disposition: A | Discharge status: Home / Self Care | Source: Ambulatory Visit | Attending: *Deleted | Admitting: *Deleted

## 2023-12-01 DIAGNOSIS — R011 Cardiac murmur, unspecified: Secondary | ICD-10-CM | POA: Insufficient documentation

## 2023-12-01 DIAGNOSIS — I071 Rheumatic tricuspid insufficiency: Secondary | ICD-10-CM | POA: Insufficient documentation

## 2023-12-01 LAB — ECHOCARDIOGRAM COMPLETE
Area-P 1/2: 3.66 cm2
S' Lateral: 2.8 cm

## 2023-12-01 NOTE — Progress Notes (Signed)
  Echocardiogram 2D Echocardiogram has been performed.  Devora Ellouise SAUNDERS 12/01/2023, 8:59 AM

## 2023-12-30 ENCOUNTER — Encounter: Payer: Self-pay | Admitting: Family Medicine

## 2023-12-30 ENCOUNTER — Telehealth: Payer: Self-pay

## 2023-12-30 ENCOUNTER — Ambulatory Visit (INDEPENDENT_AMBULATORY_CARE_PROVIDER_SITE_OTHER): Admitting: Family Medicine

## 2023-12-30 ENCOUNTER — Other Ambulatory Visit (HOSPITAL_COMMUNITY): Payer: Self-pay

## 2023-12-30 VITALS — BP 131/77 | HR 80 | Ht 60.0 in | Wt 136.0 lb

## 2023-12-30 DIAGNOSIS — G8929 Other chronic pain: Secondary | ICD-10-CM

## 2023-12-30 DIAGNOSIS — M5442 Lumbago with sciatica, left side: Secondary | ICD-10-CM

## 2023-12-30 DIAGNOSIS — M79671 Pain in right foot: Secondary | ICD-10-CM | POA: Diagnosis not present

## 2023-12-30 MED ORDER — TIZANIDINE HCL 4 MG PO TABS: 4.0000 mg | ORAL_TABLET | Freq: Every evening | ORAL | 0 refills | Status: AC | PRN

## 2023-12-30 MED ORDER — TIZANIDINE HCL 4 MG PO CAPS: 4.0000 mg | ORAL_CAPSULE | Freq: Every evening | ORAL | 0 refills | Status: DC | PRN

## 2023-12-30 NOTE — Telephone Encounter (Signed)
 Insurance does not cover tizanidine  capsules, sent in tizanidine  tabs as replacement.

## 2023-12-30 NOTE — Telephone Encounter (Signed)
 Rec'd PA request for patients Tizanidine  CAPSULES.  Patients insurance prefers TABS.   If applicable, please resend in tablet form.

## 2023-12-30 NOTE — Patient Instructions (Addendum)
 It was wonderful to see you today! Thank you for choosing [facility].   Please bring ALL of your medications with you to every visit.   Today we talked about:  For your back pain, it is likely due to standing for extended periods of time on a hard surface which can exacerbate symptoms.  I am referring you to physical therapy to help with your symptoms as this is the best place to start.  You can also use Tylenol as needed in addition to the lidocaine patches and muscle relaxer only at night when you are going to sleep. For your foot pain you do have a callus on the bottom indicating you are putting too much pressure in particular area of your foot.  I do think modifying your inserts to offload pressure in particular areas will be helpful.  I also recommend never going barefoot and wearing supportive shoes at all times even at home.  I am referring you to podiatry and they can discuss alternative modification to your shoes or consideration of custom orthotic inserts. Provided you work accommodation for today, if you desire additional accommodations going forward please request FMLA paperwork and schedule with your PCP to discuss your specific requests.  Please follow up in 6-8 weeks for back and foot pain follow up  Call the clinic at [phone] if your symptoms worsen or you have any concerns.  Please be sure to schedule follow up at the front desk before you leave today.   Izetta Nap, DO Family Medicine

## 2023-12-30 NOTE — Progress Notes (Signed)
    SUBJECTIVE:   CHIEF COMPLAINT / HPI:    Discussed the use of AI scribe software for clinical note transcription with the patient, who gave verbal consent to proceed.  Low back pain, L hip pain - Chronic, ongoing over the past year but feels like it is worsening.  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  Requesting extended period of time off. - Ibuprofen  taken twice daily with minimal relief - Voltaren  gel applied to feet, hip, and back - Previously used lidocaine patches  Right foot pain - Wears Nike tennis shoes with Dr. Heriberto inserts at work - Prefers socks with bare feet at home  - Callus on the bottom of her foot, previous injury at the area when an axe was dropped on her foot as a child  PERTINENT  PMH / PSH: HTN, HLD, MDD  OBJECTIVE:   BP 131/77   Pulse 80   Ht 5' (1.524 m)   Wt 136 lb (61.7 kg)   LMP  (LMP Unknown)   SpO2 100%   BMI 26.56 kg/m   General: NAD, pleasant, able to participate in exam MSK: Low back/left hip - TTP over lumbar spinous process extending the bilateral paraspinal musculature, L >R. - Difficulty with flexion/extension secondary to pain - Negative straight leg raise bilaterally Right foot - Notable callus over plantar surface of second metatarsal, tender to palpation - Full ROM of foot and ankle without pain - 2+ dorsalis pedis and posterior tibialis pulse - Neurovascularly intact distally  ASSESSMENT/PLAN:   Assessment & Plan Chronic bilateral low back pain with left-sided sciatica Worsening, affecting mobility and daily activities. Mild arthritis on imaging in 06/2023. Pain exacerbated by prolonged standing at work - Referred to PT -  Tizanidine  at bedtime prn, advised on fall risk - Provided lidocaine patches samples - Provided work absence note, advised on FMLA for extended leave and accommodations that would need discussion with PCP Chronic foot pain, right Callus under second metatarsal, pain likely due to improper foot mechanics and pressure distribution. - Recommended modifying shoe inserts to offload pressure. - Advised wearing shoes at all times. - Referred to podiatry for consideration of custom inserts     Dr. Izetta Nap, DO [facility]

## 2024-01-09 ENCOUNTER — Ambulatory Visit: Admitting: Podiatry

## 2024-01-09 ENCOUNTER — Ambulatory Visit (INDEPENDENT_AMBULATORY_CARE_PROVIDER_SITE_OTHER)

## 2024-01-09 ENCOUNTER — Encounter: Payer: Self-pay | Admitting: Podiatry

## 2024-01-09 DIAGNOSIS — M2042 Other hammer toe(s) (acquired), left foot: Secondary | ICD-10-CM | POA: Diagnosis not present

## 2024-01-09 DIAGNOSIS — M7741 Metatarsalgia, right foot: Secondary | ICD-10-CM

## 2024-01-09 DIAGNOSIS — S92515A Nondisplaced fracture of proximal phalanx of left lesser toe(s), initial encounter for closed fracture: Secondary | ICD-10-CM

## 2024-01-09 DIAGNOSIS — M722 Plantar fascial fibromatosis: Secondary | ICD-10-CM

## 2024-01-09 NOTE — Progress Notes (Signed)
 Subjective:  Patient ID: Stacy Nguyen, female    DOB: 05-Jul-1955,  MRN: [id number]  Chief Complaint  Patient presents with   Foot Pain    My right foot hurts on the ball of the foot and I think I broke my toe next to my little toe on my left foot.    Discussed the use of AI scribe software for clinical note transcription with the patient, who gave verbal consent to proceed.  History of Present Illness Stacy Nguyen is a 68 year old female who presents with left fourth toe pain and right foot metatarsalgia.  She experiences significant pain in her left fourth toe following an incident on Thanksgiving Day where she hit her foot, suspecting a fracture. The toe is swollen and extremely painful, particularly when stepping on it. She has been using band-aids to stabilize the toe and alleviate movement.  She also has chronic pain in her right foot, specifically under the second metatarsal head and MTP joint. This pain has persisted since a teenage injury involving an ax, which she believes may have affected the tendon and led to a hammer toe deformity. She experiences discomfort in this area, which worsens with weight-bearing activities.  She manages her symptoms with ibuprofen . She mentions difficulty affording custom orthotics but is considering more affordable options. She has been wearing shoes consistently to manage her symptoms and avoid exacerbating the pain.      Objective:    Physical Exam VASCULAR: DP and PT pulse palpable. Foot is warm and well-perfused. Capillary fill time is brisk. DERMATOLOGIC: Normal skin turgor, texture, and temperature. No open lesions, rashes, or ulcerations. NEUROLOGIC: Normal sensation to light touch and pressure. No paresthesias on examination. ORTHOPEDIC: Pain on palpation of left fourth toe, which is swollen and edematous. Pain at plantar second metatarsal head and MTP joint with diffuse callus. Bilateral hallux valgus and hammer toe contractures.     No images are attached to the encounter.    Results RADIOLOGY Left foot X-ray: Oblique spiral fracture with nondisplacement of the fourth proximal phalanx, left fourth toe. Bilateral hallux valgus and hammer toe deformities. Elongated second metatarsal bilateral. No fracture or stress fracture on the right foot. (01/08/2024)  PROCEDURE Procedure: Buddy taping of left fourth toe Description: Taping of the left fourth toe to the third toe with a non-adhesive tape for stabilization and compression for reduction of fracture alignment and support.   Assessment:   1. Metatarsalgia of right foot   2. Nondisplaced fracture of proximal phalanx of left lesser toe(s), initial encounter for closed fracture      Plan:  Patient was evaluated and treated and all questions answered.  Assessment and Plan Assessment & Plan Left fourth toe proximal phalanx fracture Oblique spiral fracture of the left fourth toe proximal phalanx, nondisplaced and not involving the joint. The fracture is expected to heal in approximately two months, with significant discomfort for the first month and residual annoyance for the second month. No surgical intervention is required. - Buddy tape the left fourth toe to the third toe for 3-4 weeks. - Use a surgical shoe for one month, then transition to regular shoe gear as tolerated. - Apply ice to reduce swelling. - Avoid tight shoes; use roomy footwear like sneakers or clogs after returning out of the surgical shoe.  Right foot metatarsalgia Chronic metatarsalgia in the right foot, likely due to an elongated second metatarsal and hammer toe deformity. Pain is localized under the plantar second metatarsal head and  MTP joint. Conservative management is preferred due to financial constraints. Surgical options include shortening the bone and correcting the hammer toe, but these involve risks and a longer recovery time. - Use a metatarsal pad to offload pressure from the  joint. - Wear supportive shoes with good insoles, avoiding direct pressure on the painful spot. - Technical Sales Engineer orthotics from [employer] for additional support. - Take ibuprofen  or Aleve  for pain management. - Avoid going barefoot; wear house shoes with insoles. - Will consider corticosteroid injection if symptoms worsen.      Return in about 3 months (around 04/08/2024) for f/u left foot fx, metatarsalgia.

## 2024-01-09 NOTE — Patient Instructions (Addendum)
  VISIT SUMMARY: During today's visit, we discussed the pain you are experiencing in your left fourth toe and right foot. You have a fracture in your left fourth toe and chronic pain in your right foot due to metatarsalgia. We reviewed your symptoms, and I provided recommendations to help manage your pain and promote healing.  YOUR PLAN: -LEFT FOURTH TOE PROXIMAL PHALANX FRACTURE: You have a fracture in the bone of your left fourth toe. This type of fracture is expected to heal in about two months. To help with healing, you should buddy tape your left fourth toe to the third toe for 3-4 weeks, use a surgical shoe for one month, and then transition to regular shoes as you can tolerate. Apply ice to reduce swelling and avoid tight shoes; instead, wear roomy footwear like sneakers or clogs.  -RIGHT FOOT METATARSALGIA: Metatarsalgia is pain in the ball of your foot, often due to an elongated bone and hammer toe deformity. To manage this, use a metatarsal pad to relieve pressure, wear supportive shoes with good insoles, and consider purchasing E[employer]old orthotics from [employer]. You can take ibuprofen  or Aleve  for pain, and avoid going barefoot by wearing house shoes with insoles. If your symptoms worsen, we may consider a corticosteroid injection.  INSTRUCTIONS: Follow up in two months to reassess the healing of your left fourth toe and the management of your right foot pain. If you experience increased pain or any new symptoms, please contact our office.                      Contains text generated by [employer]bridge.                                 Contains text generated by Abridge.

## 2024-01-11 ENCOUNTER — Other Ambulatory Visit: Payer: Self-pay

## 2024-01-11 MED ORDER — FLUTICASONE PROPIONATE 50 MCG/ACT NA SUSP: 1.0000 | Freq: Every day | NASAL | 6 refills | Status: AC

## 2024-01-17 NOTE — Therapy (Signed)
 OUTPATIENT PHYSICAL THERAPY EVALUATION   Patient Name: Stacy Nguyen MRN: [id number] DOB:03/23/1955, 68 y.o., female Today's Date: 01/17/2024  END OF SESSION:   Past Medical History:  Diagnosis Date   Allergy    Anemia    as achild   Anxiety    Arthritis    all over   Cataract    bilateral - MD just watching   Depression    Hyperlipidemia    Hypertension    Type 2 diabetes mellitus (HCC)    Vertigo 08/03/2021   Past Surgical History:  Procedure Laterality Date   [place]     [place] than [place]   SHOULDER SURGERY Left 1986   TUBAL LIGATION     UPPER GASTROINTESTINAL ENDOSCOPY     Patient Active Problem List   Diagnosis Date Noted   Snoring 08/19/2022   Depression, major, single episode, mild 04/28/2022   Osteopenia 04/27/2022   Sensorineural hearing loss (SNHL) of both ears 02/16/2022   Vertigo 08/03/2021   Prediabetes 02/19/2021   Hyperlipidemia 10/12/2018   HTN (hypertension) 08/25/2018    PCP: Orie Milda CROME, MD  REFERRING PROVIDER: Orie Milda CROME, MD  REFERRING DIAG: G89.29,M54.42 (ICD-10-CM) - Chronic bilateral low back pain with left-sided sciatica  Rationale for Evaluation and Treatment: Rehabilitation  THERAPY DIAG:  No diagnosis found.  ONSET DATE: ***  SUBJECTIVE:                                                                                                                                                                                           SUBJECTIVE STATEMENT: ***  PERTINENT HISTORY:  ***  PAIN:  NPRS scale: ***/10 Pain location: *** Pain description: *** Aggravating factors: *** Relieving factors: ***  PRECAUTIONS: None  WEIGHT BEARING RESTRICTIONS: No  FALLS:  Has patient fallen in last 6 months? No  LIVING ENVIRONMENT: Lives with: {[facility] lives with:25569::lives with their family} Lives in: {Lives in:25570} Stairs: {opstairs:27293} Has following equipment at home: {Assistive  devices:23999}  OCCUPATION: ***  PLOF: Independent  PATIENT GOALS: ***  Next MD Visit:    OBJECTIVE:   DIAGNOSTIC FINDINGS:  ***  PATIENT SURVEYS:  Patient-Specific Activity Scoring Scheme  0 represents "unable to perform." 10 represents "able to perform at prior level. 0 1 2 3 4 5 6 7 8 9  10 (Date and Score)   Activity Eval  01/18/2024    1. ***      2. ***      3. ***    4.    5.    Score ***    Total score = sum of the  activity scores/number of activities Minimum detectable change (90%CI) for average score = 2 points Minimum detectable change (90%CI) for single activity score = 3 points  SCREENING FOR RED FLAGS: 01/18/2024 Bowel or bladder incontinence: {Bzd/Wn:695039105} Cauda equina syndrome: {Bzd/Wn:695039105}  COGNITION: 01/18/2024 Overall cognitive status: WFL normal      SENSATION: 01/18/2024 {sensation:27233}  MUSCLE LENGTH: 01/18/2024 Hamstrings: Right *** deg; Left *** deg Debby test: Right *** deg; Left *** deg  POSTURE:  01/18/2024 {posture:25561}  PALPATION: 01/18/2024 ***  LUMBAR ROM:  01/18/2024 Directional Preference Assessment: Centralization: Peripheralization:   AROM Eval 01/18/2024  Flexion   Extension   Right lateral flexion   Left lateral flexion   Right rotation   Left rotation    (Blank rows = not tested)  LOWER EXTREMITY ROM:      Right Eval 01/18/2024 Left Eval 01/18/2024  Hip flexion    Hip extension    Hip abduction    Hip adduction    Hip internal rotation    Hip external rotation    Knee flexion    Knee extension    Ankle dorsiflexion    Ankle plantarflexion    Ankle inversion    Ankle eversion     (Blank rows = not tested)  LOWER EXTREMITY MMT:    MMT Right Eval 01/18/2024 Left Eval 01/18/2024  Hip flexion    Hip extension    Hip abduction    Hip adduction    Hip internal rotation    Hip external rotation    Knee flexion    Knee extension    Ankle dorsiflexion     Ankle plantarflexion    Ankle inversion    Ankle eversion     (Blank rows = not tested)  LUMBAR SPECIAL TESTS:  01/18/2024 {lumbar special test:25242}  FUNCTIONAL TESTS:  01/18/2024 {Functional tests:24029}  GAIT: 01/18/2024                                                                                                                                                                                                                   TODAY'S TREATMENT:  DATE:  01/18/2024  Therex:    HEP instruction/performance c cues for techniques, handout provided.  Trial set performed of each for comprehension and symptom assessment.  See below for exercise list  PATIENT EDUCATION:  01/18/2024 Education details: HEP, POC Person educated: Patient Education method: Programmer, Multimedia, Demonstration, Verbal cues, and Handouts Education comprehension: verbalized understanding, returned demonstration, and verbal cues required  HOME EXERCISE PROGRAM: ***  ASSESSMENT:  CLINICAL IMPRESSION: Patient is a *** y.o. who comes to clinic with complaints of ***pain with mobility, strength and movement coordination deficits that impair their ability to perform usual daily and recreational functional activities without increase difficulty/symptoms at this time.  Patient to benefit from skilled PT services to address impairments and limitations to improve to previous level of function without restriction secondary to condition.   OBJECTIVE IMPAIRMENTS: {opptimpairments:25111}.   ACTIVITY LIMITATIONS: {activitylimitations:27494}  PARTICIPATION LIMITATIONS: {participationrestrictions:25113}  PERSONAL FACTORS: {Personal factors:25162} are also affecting patient's functional outcome.   REHAB POTENTIAL: {rehabpotential:25112}  CLINICAL DECISION MAKING: {clinical decision making:25114}  EVALUATION COMPLEXITY:  {Evaluation complexity:25115}   GOALS: Goals reviewed with patient? Yes  SHORT TERM GOALS: (target date for Short term goals are 3 weeks ***)  1. Patient will demonstrate independent use of home exercise program to maintain progress from in clinic treatments.  Goal status: New  LONG TERM GOALS: (target dates for all long term goals are 10 weeks  *** )   1. Patient will demonstrate/report pain at worst less than or equal to 2/10 to facilitate minimal limitation in daily activity secondary to pain symptoms.  Goal status: New   2. Patient will demonstrate independent use of home exercise program to facilitate ability to maintain/progress functional gains from skilled physical therapy services.  Goal status: New   3. Patient will demonstrate Patient specific functional scale avg > or = *** to indicate reduced disability due to condition.   Goal status: New   4. Patient will demonstrate lumbar extension 100 % WFL s symptoms to facilitate upright standing, walking posture at PLOF s limitation.  Goal status: New   5.  ***  Goal status: New   6.  *** Goal status: New   7.  *** Goal Status: New  PLAN:  PT FREQUENCY: 1-2x/week  PT DURATION: 10 weeks  PLANNED INTERVENTIONS: Can include 02853- PT Re-evaluation, 97110-Therapeutic exercises, 97530- Therapeutic activity, [id number]- Neuromuscular re-education, 97535- Self Care, 97140- Manual therapy, [phone]- Gait training, [phone]- Orthotic Fit/training, [phone]- Canalith repositioning, [id number]- Aquatic Therapy, [phone]- Electrical stimulation (unattended), [id number] Physical performance testing, 97016- Vasopneumatic device, [id number]- Ultrasound, [id number]- Traction (mechanical), [id number]- Ionotophoresis 4mg /ml Dexamethasone,  20560 - Needle insertion w/o injection 1 or 2 muscles, 20561 - Needle insertion w/o injection 3 or more muscles.    Patient/Family education, Balance training, Stair training, Taping, Dry Needling, Joint mobilization, Joint manipulation,  Spinal manipulation, Spinal mobilization, Scar mobilization, Vestibular training, Visual/preceptual remediation/compensation, DME instructions, Cryotherapy, and Moist heat.  All performed as medically necessary.  All included unless contraindicated  PLAN FOR NEXT SESSION: Review HEP knowledge/results.    Ozell Silvan, PT, DPT, OCS, ATC 01/17/24  4:08 PM

## 2024-01-18 ENCOUNTER — Encounter: Payer: Self-pay | Admitting: Rehabilitative and Restorative Service Providers"

## 2024-01-18 ENCOUNTER — Ambulatory Visit: Admitting: Rehabilitative and Restorative Service Providers"

## 2024-01-18 DIAGNOSIS — M5459 Other low back pain: Secondary | ICD-10-CM | POA: Diagnosis not present

## 2024-01-18 DIAGNOSIS — M79672 Pain in left foot: Secondary | ICD-10-CM | POA: Diagnosis not present

## 2024-01-18 DIAGNOSIS — R262 Difficulty in walking, not elsewhere classified: Secondary | ICD-10-CM | POA: Diagnosis not present

## 2024-01-18 DIAGNOSIS — M25552 Pain in left hip: Secondary | ICD-10-CM | POA: Diagnosis not present

## 2024-01-18 DIAGNOSIS — M6281 Muscle weakness (generalized): Secondary | ICD-10-CM | POA: Diagnosis not present

## 2024-01-18 DIAGNOSIS — M79671 Pain in right foot: Secondary | ICD-10-CM

## 2024-01-24 ENCOUNTER — Ambulatory Visit: Admitting: Family Medicine

## 2024-01-24 ENCOUNTER — Encounter: Payer: Self-pay | Admitting: Family Medicine

## 2024-01-24 VITALS — BP 118/70 | HR 78 | Ht 60.0 in | Wt 138.0 lb

## 2024-01-24 DIAGNOSIS — M25552 Pain in left hip: Secondary | ICD-10-CM | POA: Diagnosis not present

## 2024-01-24 NOTE — Therapy (Signed)
 OUTPATIENT PHYSICAL THERAPY TREATMENT   Patient Name: Stacy Nguyen MRN: [id number] DOB:1955-10-19, 68 y.o., female Today's Date: 01/25/2024  END OF SESSION:  PT End of Session - 01/25/24 1018     Visit Number 2    Number of Visits 20    Date for Recertification  03/28/24    Authorization Type [employer] $20 copay    Progress Note Due on Visit 10    PT Start Time 1018    PT Stop Time 1059    PT Time Calculation (min) 41 min    Activity Tolerance Patient limited by pain    Behavior During Therapy WFL for tasks assessed/performed           Past Medical History:  Diagnosis Date   Allergy    Anemia    as achild   Anxiety    Arthritis    all over   Cataract    bilateral - MD just watching   Depression    Hyperlipidemia    Hypertension    Type 2 diabetes mellitus (HCC)    Vertigo 08/03/2021   Past Surgical History:  Procedure Laterality Date   [place]     [place]   SHOULDER SURGERY Left 1986   TUBAL LIGATION     UPPER GASTROINTESTINAL ENDOSCOPY     Patient Active Problem List   Diagnosis Date Noted   Snoring 08/19/2022   Depression, major, single episode, mild 04/28/2022   Osteopenia 04/27/2022   Sensorineural hearing loss (SNHL) of both ears 02/16/2022   Vertigo 08/03/2021   Prediabetes 02/19/2021   Hyperlipidemia 10/12/2018   HTN (hypertension) 08/25/2018    PCP: Orie Milda CROME, MD  REFERRING PROVIDER: Orie Milda CROME, MD  REFERRING DIAG: G89.29,M54.42 (ICD-10-CM) - Chronic bilateral low back pain with left-sided sciatica  Rationale for Evaluation and Treatment: Rehabilitation  THERAPY DIAG:  Other low back pain  Pain in left hip  Muscle weakness (generalized)  Difficulty in walking, not elsewhere classified  Pain in right foot  Pain in left foot  ONSET DATE: Chronic worsening over last 3-4 months.   SUBJECTIVE:                                                                                                                                                                                            SUBJECTIVE STATEMENT: Patient reports that she feels like she is loosening up from performing her HEP.     PERTINENT HISTORY:  Medical history of anemia, anxiety, arthritis, depression, hyperlipidemia, HTN, DM2, vertigo. Concurrent history of Rt foot pain.  Also indicate broken toe  in Lt foot on Thanksgiving.    Pt came to clinic c complaints of back, lt hip pain. Pt indicated insidious worsening of symptoms  Pt indicated difficulty with bending, twisting, weight on Lt leg.  Reported pain across back and Lt hip/thigh.  Reported difficulty in walking, transfers due to symptoms.  Pt indicated no numbness/tingling.  Pt indicated symptoms have worsened over time.   Pt indicated some difficulty sleeping due to symptoms.   PAIN:  NPRS scale: 5-6/10 at beginning of session  Pain location: back, Lt hip pain/thigh pain and into lateral lower leg at times.  Pain description: tight, cramp, constant Aggravating factors: WB on Lt leg, transfers, walking, bending, twisting.  Relieving factors: topical gel.   PRECAUTIONS: None  WEIGHT BEARING RESTRICTIONS: No  FALLS:  Has patient fallen in last 6 months? 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 Mild degenerative joint changes of lumbar spine.  PATIENT SURVEYS:  Patient-Specific Activity Scoring Scheme  0 represents unable to perform. 10 represents able to perform at prior level. 0 1 2 3 4 5 6 7 8 9  10 (Date and  Score)   Activity Eval  01/18/2024    1. walking 2     2. Bending  2    3. Bed mobility 2   4. Twisting at waist 2   5. stairs 2   Score 2 avg    Total score = sum of the activity scores/number of activities Minimum detectable change (90%CI) for average score = 2 points Minimum detectable change (90%CI) for single activity score = 3 points  SCREENING FOR RED FLAGS: 01/18/2024 Bowel or bladder incontinence: No Cauda equina syndrome: No  COGNITION: 01/18/2024 Overall cognitive status: WFL normal      SENSATION: 01/18/2024 [facility]  MUSCLE LENGTH: 01/18/2024 Passive SLR Lt 40 deg with back/hip pain Passive SLR Rt 60 deg with Rt leg tightness  POSTURE:  01/18/2024 Standing posture offtset with trunk lean to Rt due to walking shoe on Lt.  Provided shoe lift in Rt shoe to help offset. Analysis in standing without walking shoe showed unremarkable hip iliac crest alignment.  Reduced lumbar lordosis noted in standing.   PALPATION: 01/18/2024 Tenderness to light touch in Lt QL, Lt paraspinals, Lt glute max/med/min.  Trigger points noted.   LUMBAR ROM:  01/18/2024 Directional Preference Assessment: Centralization: not noted Peripheralization:  not noted  AROM Eval 01/18/2024  Flexion To mid thigh with pianful arc, gower's sign upon return  Extension 50% WFL low back pain  Right lateral flexion   Left lateral flexion   Right rotation   Left rotation    (Blank rows = not tested)  LOWER EXTREMITY ROM:      Right Eval 01/18/2024 Left Eval 01/18/2024 01/25/2024  Hip flexion   Rt AROM: 85deg Rt PROM: 105deg  Lt AROM: 74deg Lt PROM: 94deg  Hip extension     Hip abduction     Hip adduction     Hip internal rotation     Hip external rotation     Knee flexion     Knee extension  Ankle dorsiflexion     Ankle plantarflexion     Ankle inversion     Ankle eversion      (Blank rows = not tested)  LOWER EXTREMITY MMT:    MMT Right Eval 01/18/2024  Left Eval 01/18/2024  Hip flexion 5/5 4/5  Hip extension    Hip abduction 3+/5 2+/5  Hip adduction    Hip internal rotation    Hip external rotation    Knee flexion 4/5 4/5  Knee extension 5/5 4/5  Ankle dorsiflexion    Ankle plantarflexion    Ankle inversion    Ankle eversion     (Blank rows = not tested)  LUMBAR SPECIAL TESTS:  01/18/2024 (+) Lt slump for increased tightness in posterior leg compared to Rt.  (-) crossed SLR bilaterally   FUNCTIONAL TESTS:  01/18/2024 No specific testing today.   01/25/2024 TUG 22.56s   GAIT: 01/18/2024 Independent ambulation with walking shoe on Lt foot due to toe fracture. Antalgic gait noted due to it.                                                                                                                                                                                                                    TODAY'S TREATMENT:                                                                                                         DATE:  01/25/2024  TherEx:  Nustep with bilat UE/LE level 3 for 8 minutes  AROM/PROM for hip performed and noted above  PPT 1x10 with 2s holds  Supine figure 4 2x30s each side  PT assisted for Lt side stretch as patient is highly painful   Physical Performance  TUG with results noted above   Manual:  Attempted STM to Lt glutes/piriformis, though discontinued secondary to high levels of pain    TODAY'S TREATMENT:  DATE:  01/18/2024  Therex:    HEP instruction/performance c cues for techniques, handout provided.  Trial set performed of each for comprehension and symptom assessment.  See below for exercise list  TherActivity Education and trials of supine to sit to supine transfers c focus on log rolling techniques.  Verbal, visual and tactile cues performed during activity to help improve techniques.    PATIENT EDUCATION:  01/18/2024 Education details: HEP, POC Person educated: Patient Education method: Programmer, Multimedia, Demonstration, Verbal cues, and Handouts Education comprehension: verbalized understanding, returned demonstration, and verbal cues required  HOME EXERCISE PROGRAM: Access Code: [email or web address] URL: [email or web address] Date: 01/18/2024 Prepared by: Ozell Silvan  Exercises - Supine Lower Trunk Rotation  - 2-3 x daily - 7 x weekly - 1 sets - 3-5 reps - 15 hold - Standing Lumbar Extension  - 2-3 x daily - 7 x weekly - 1 sets - 5-10 reps - Supine Bridge  - 1-2 x daily - 7 x weekly - 1-2 sets - 10 reps - 2 hold - Clamshell  - 1-2 x daily - 7 x weekly - 2-3 sets - 10-15 reps  ASSESSMENT:  CLINICAL IMPRESSION: Patient arrived to session noting similar pain levels as to eval and a has also cut her Rt middle finger that affects hand activities. Patient was highly limited by pain this date and required increased time to transition between activities. Patient unable to tolerate manual to Lt glute/piriformis and low back. Patient will be bringing shoe insert to next session as we were limited by pain and time to assess fit. Patient will continue to benefit from skilled PT.   OBJECTIVE IMPAIRMENTS: Abnormal gait, decreased activity tolerance, decreased balance, decreased coordination, decreased endurance, decreased mobility, difficulty walking, decreased ROM, decreased strength, increased fascial restrictions, impaired perceived functional ability, increased muscle spasms, impaired flexibility, improper body mechanics, postural dysfunction, and pain.   ACTIVITY LIMITATIONS: carrying, lifting, bending, sitting, standing, squatting, sleeping, stairs, transfers, bed mobility, bathing, dressing, reach over head, hygiene/grooming, and locomotion level  PARTICIPATION LIMITATIONS: meal prep, cleaning, laundry, interpersonal relationship, driving, shopping, and community  activity  PERSONAL FACTORS: Medical history of anemia, anxiety, arthritis, depression, hyperlipidemia, HTN, DM2, vertigo, multiple pain areas, length of time since onset/severity of symptoms  are also affecting patient's functional outcome.   REHAB POTENTIAL: Good  CLINICAL DECISION MAKING: Evolving/moderate complexity  EVALUATION COMPLEXITY: Moderate   GOALS: Goals reviewed with patient? Yes  SHORT TERM GOALS: (target date for Short term goals are 3 weeks 02/08/2024)  1. Patient will demonstrate independent use of home exercise program to maintain progress from in clinic treatments.  Goal status: New  LONG TERM GOALS: (target dates for all long term goals are 10 weeks  03/28/2024 )   1. Patient will demonstrate/report pain at worst less than or equal to 2/10 to facilitate minimal limitation in daily activity secondary to pain symptoms.  Goal status: New   2. Patient will demonstrate independent use of home exercise program to facilitate ability to maintain/progress functional gains from skilled physical therapy services.  Goal status: New   3. Patient will demonstrate Patient specific functional scale avg > or = 8/10 to indicate reduced disability due to condition.   Goal status: New   4. Patient will demonstrate lumbar extension 100 % WFL s symptoms to facilitate upright standing, walking posture at PLOF s limitation.  Goal status: New   5.  Patient will demonstrate bilateral hip MMT 4/5 or greater for abduction, 5/5 for flexion, knee extension /flexion 5/5 bilateral  for usual transfers, stairs and ambulation.   Goal status: New   6.  Patient will demonstrate ascending/descending stairs reciprocally s UE assist for community integration.   Goal status: New   7.  Patient will demonstrate passive SLR bilaterally to > or = 70 deg to facilitate usual mobility at PLOF.  Goal Status: New  PLAN:  PT FREQUENCY: 1-2x/week  PT DURATION: 10 weeks  PLANNED INTERVENTIONS:  Can include 02853- PT Re-evaluation, 97110-Therapeutic exercises, 97530- Therapeutic activity, [id number]- Neuromuscular re-education, 97535- Self Care, 97140- Manual therapy, [phone]- Gait training, [phone]- Orthotic Fit/training, [phone]- Canalith repositioning, [id number]- Aquatic Therapy, [phone]- Electrical stimulation (unattended), [id number] Physical performance testing, 97016- Vasopneumatic device, [id number]- Ultrasound, [id number]- Traction (mechanical), [id number]- Ionotophoresis 4mg /ml Dexamethasone,  20560 - Needle insertion w/o injection 1 or 2 muscles, 20561 - Needle insertion w/o injection 3 or more muscles.    Patient/Family education, Balance training, Stair training, Taping, Dry Needling, Joint mobilization, Joint manipulation, Spinal manipulation, Spinal mobilization, Scar mobilization, Vestibular training, Visual/preceptual remediation/compensation, DME instructions, Cryotherapy, and Moist heat.  All performed as medically necessary.  All included unless contraindicated  PLAN FOR NEXT SESSION: manual (within tolerable limits), trial shoe inserts, LE strength and flexibility, functional mobility  Susannah Daring, PT, DPT 01/25/2024 11:12 AM     Date of referral: 12/30/2023 Referring provider: Orie Milda CROME, MD Referring diagnosis? G89.29,M54.42 (ICD-10-CM) - Chronic bilateral low back pain with left-sided sciatica Treatment diagnosis? (if different than referring diagnosis) M54.59, M25.552, M62.81, R26.2, M79.671, F20.327  What was this (referring dx) caused by? Ongoing Issue  Lysle of Condition: Chronic (continuous duration > 3 months)   Laterality: Both bilateral back, Lt hip  Current Functional Measure Score: Patient Specific Functional Scale eval avg:   Objective measurements identify impairments when they are compared to normal values, the uninvolved extremity, and prior level of function.  [x]  Yes  []  No  Objective assessment of functional ability: Severe functional limitations   Briefly  describe symptoms: Pt came to clinic c complaints of back, lt hip pain. Pt indicated insidious worsening of symptoms  Pt indicated difficulty with bending, twisting, weight on Lt leg.  Reported pain across back and Lt hip/thigh.  Reported difficulty in walking, transfers due to symptoms.  Pt indicated no numbness/tingling.  Pt indicated symptoms have worsened over time.   Pt indicated some difficulty sleeping due to symptoms.   How did symptoms start: Insidious worsening over time.   Average pain intensity:  Last 24 hours: 9/10 at worst  Past week: 9/10 at worst  How often does the pt experience symptoms? Constantly  How much have the symptoms interfered with usual daily activities? Extremely  How has condition changed since care began at this facility? NA - initial visit  In general, how is the patients overall health? Good   BACK PAIN (STarT Back Screening Tool) Has pain spread down the leg(s) at some time in the last 2 weeks? yes Has there been pain in the shoulder or neck at some time in the last 2 weeks? no Has the pt only walked short distances because of back pain? yes Has patient dressed more slowly because of back pain in the past 2 weeks? yes Does patient think it's not safe for a person with this condition to be physically active? no Does patient have worrying thoughts a lot of the time? yes Does patient feel back pain is terrible and will never get any better? no Has patient stopped enjoying things they usually enjoy? yes Overall, how bothersome has  back pain been in the last 2 weeks?                    Very Much

## 2024-01-24 NOTE — Progress Notes (Unsigned)
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   Out since November for her hip pain.   PERTINENT  PMH / PSH: ***  OBJECTIVE:   BP 118/70   Pulse 78   Ht 5' (1.524 m)   Wt 138 lb (62.6 kg)   LMP  (LMP Unknown)   SpO2 96%   BMI 26.95 kg/m   ***  ASSESSMENT/PLAN:   Assessment & Plan      Twyla Nearing, MD [facility]

## 2024-01-24 NOTE — Progress Notes (Unsigned)
° ° °  SUBJECTIVE:   CHIEF COMPLAINT / HPI:   AT is a 83 F that pf f/u of chronic low back pain.  - Previously seen in November for chronic low back pain w/ sciatica affecting L hip, was working on [employer] paperwork for this.  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   PERTINENT  PMH / PSH: HTN, HLD, prediabetes  OBJECTIVE:   BP 118/70   Pulse 78   Ht 5' (1.524 m)   Wt 138 lb (62.6 kg)   LMP  (LMP Unknown)   SpO2 96%   BMI 26.95 kg/m   General: Alert, pleasant woman. NAD. HEENT: NCAT. MMM. CV: RRR, no murmurs.  Resp: CTAB, no wheezing or crackles. Normal WOB on RA.   Ext: Moves all ext spontaneously. Regular gait, Skin: Warm, well perfused    ASSESSMENT/PLAN:   Assessment & Plan Left hip pain Improving and feels capable of returning to work. Pt should be able to return to work for childcare about 1 halfday per week. Completed FMLA paperwork for the time she was out (uploaded images under media) and provided a letter to allow her to return to work 02/09/2024.      Stacy Nearing, MD [facility]

## 2024-01-25 ENCOUNTER — Ambulatory Visit

## 2024-01-25 ENCOUNTER — Telehealth: Payer: Self-pay

## 2024-01-25 ENCOUNTER — Encounter: Payer: Self-pay | Admitting: Family Medicine

## 2024-01-25 DIAGNOSIS — M79671 Pain in right foot: Secondary | ICD-10-CM

## 2024-01-25 DIAGNOSIS — R262 Difficulty in walking, not elsewhere classified: Secondary | ICD-10-CM

## 2024-01-25 DIAGNOSIS — M6281 Muscle weakness (generalized): Secondary | ICD-10-CM | POA: Diagnosis not present

## 2024-01-25 DIAGNOSIS — M5459 Other low back pain: Secondary | ICD-10-CM | POA: Diagnosis not present

## 2024-01-25 DIAGNOSIS — M25552 Pain in left hip: Secondary | ICD-10-CM

## 2024-01-25 DIAGNOSIS — M79672 Pain in left foot: Secondary | ICD-10-CM | POA: Diagnosis not present

## 2024-01-25 NOTE — Telephone Encounter (Signed)
 Patient called stating that she was supposed to get a letter allowing her to return to work during her visit with Dr Elicia yesterday, but didn't get one. Asks if we can fax a letter to [facility] at [phone] stating the date she can return to work and any restriciions that she may have please

## 2024-01-27 ENCOUNTER — Encounter: Admitting: Rehabilitative and Restorative Service Providers"

## 2024-01-27 NOTE — Telephone Encounter (Signed)
 Confirmed fax information with patient.  Letter faxed to benefits coordinator, per request.

## 2024-01-30 ENCOUNTER — Ambulatory Visit (INDEPENDENT_AMBULATORY_CARE_PROVIDER_SITE_OTHER)

## 2024-01-30 VITALS — Ht 60.0 in | Wt 136.0 lb

## 2024-01-30 DIAGNOSIS — Z0000 Encounter for general adult medical examination without abnormal findings: Secondary | ICD-10-CM

## 2024-01-30 NOTE — Progress Notes (Signed)
 "  Chief Complaint  Patient presents with   Medicare Wellness    SUBSEQUENT     Subjective:   Stacy Nguyen is a 68 y.o. female who presents for a Medicare Annual Wellness Visit.  Visit info / Clinical Intake: Medicare Wellness Visit Type:: Subsequent Annual Wellness Visit Persons participating in visit and providing information:: patient Medicare Wellness Visit Mode:: Video Since this visit was completed virtually, some vitals may be partially provided or unavailable. Missing vitals are due to the limitations of the virtual format.: Documented vitals are patient reported If Telephone or Video please confirm:: I connected with patient using audio/video enable telemedicine. I verified patient identity with two identifiers, discussed telehealth limitations, and patient agreed to proceed. Patient Location:: HOME Provider Location:: HOME OFFICE Interpreter Needed?: No Pre-visit prep was completed: yes AWV questionnaire completed by patient prior to visit?: yes Date:: 01/30/24 Living arrangements:: (!) lives alone Patient's Overall Health Status Rating: good Typical amount of pain: some Does pain affect daily life?: (!) yes Are you currently prescribed opioids?: no  Dietary Habits and Nutritional Risks How many meals a day?: 3 Eats fruit and vegetables daily?: yes Most meals are obtained by: preparing own meals In the last 2 weeks, have you had any of the following?: none Diabetic:: no  Functional Status Activities of Daily Living (to include ambulation/medication): Independent Ambulation: Independent Medication Administration: Independent Home Management (perform basic housework or laundry): Independent Manage your own finances?: yes Primary transportation is: driving Concerns about vision?: -- (WEARS EYEGLASSES; GROAT EYE CARE) Concerns about hearing?: no  Fall Screening Falls in the past year?: 1 Number of falls in past year: 0 Was there an injury with Fall?: 1 (FELL  DOWN STAIRS) Fall Risk Category Calculator: 2 Patient Fall Risk Level: Moderate Fall Risk  Fall Risk Patient at Risk for Falls Due to: No Fall Risks Fall risk Follow up: Falls evaluation completed; Education provided  Home and Transportation Safety: All rugs have non-skid backing?: yes All stairs or steps have railings?: yes Grab bars in the bathtub or shower?: (!) no Have non-skid surface in bathtub or shower?: yes Good home lighting?: yes Regular seat belt use?: yes Hospital stays in the last year:: no  Cognitive Assessment Difficulty concentrating, remembering, or making decisions? : no Will 6CIT or Mini Cog be Completed: yes What year is it?: 0 points What month is it?: 0 points Give patient an address phrase to remember (5 components): [place] [address] About what time is it?: 0 points Count backwards from 20 to 1: 0 points Say the months of the year in reverse: 0 points Repeat the address phrase from earlier: 0 points 6 CIT Score: 0 points  Advance Directives (For Healthcare) Does Patient Have a Medical Advance Directive?: Yes Does patient want to make changes to medical advance directive?: No - Patient declined Type of Advance Directive: Healthcare Power of [place]; Living will Copy of Healthcare Power of Attorney in Chart?: No - copy requested Copy of Living Will in Chart?: No - copy requested Would patient like information on creating a medical advance directive?: No - Patient declined  Reviewed/Updated  Reviewed/Updated: Reviewed All (Medical, Surgical, Family, Medications, Allergies, Care Teams, Patient Goals)    Allergies (verified) Lisinopril   Current Medications (verified) Outpatient Encounter Medications as of 01/30/2024  Medication Sig   amLODipine  (NORVASC ) 5 MG tablet TAKE 1 TABLET(5 MG) BY MOUTH AT BEDTIME   atorvastatin  (LIPITOR) 10 MG tablet TAKE 1 TABLET(10 MG) BY MOUTH DAILY  azelastine  (ASTELIN ) 0.1 % nasal spray USE 2 SPRAYS IN EACH  NOSTRIL TWICE DAILY   cetirizine  (ZYRTEC ) 10 MG tablet Take 1 tablet (10 mg total) by mouth daily.   Cholecalciferol (VITAMIN D3) 50 MCG (2000 UT) CAPS Take 1 capsule by mouth daily.   diclofenac  Sodium (VOLTAREN ) 1 % GEL Apply 2 g topically 4 (four) times daily as needed (pain).   fluticasone  (FLONASE ) 50 MCG/ACT nasal spray Place 1 spray into both nostrils daily.   losartan  (COZAAR ) 50 MG tablet TAKE 1 TABLET(50 MG) BY MOUTH DAILY   meclizine  (ANTIVERT ) 12.5 MG tablet Take 1 tablet (12.5 mg total) by mouth 3 (three) times daily as needed for dizziness (vertigo).   ondansetron  (ZOFRAN -ODT) 4 MG disintegrating tablet Take 1 tablet (4 mg total) by mouth every 8 (eight) hours as needed for nausea or vomiting.   tiZANidine  (ZANAFLEX ) 4 MG tablet Take 1 tablet (4 mg total) by mouth at bedtime as needed for muscle spasms.   acetaminophen (TYLENOL) 500 MG tablet Take 500 mg by mouth every 6 (six) hours as needed for mild pain, moderate pain or headache. (Patient not taking: Reported on 01/30/2024)   No facility-administered encounter medications on file as of 01/30/2024.    History: Past Medical History:  Diagnosis Date   Allergy    Anemia    as achild   Anxiety    Arthritis    all over   Cataract    bilateral - MD just watching   Depression    Hyperlipidemia    Hypertension    Type 2 diabetes mellitus (HCC)    Vertigo 08/03/2021   Past Surgical History:  Procedure Laterality Date   COLONOSCOPY     greater than 10 yrs ago out of state   SHOULDER SURGERY Left 1986   TUBAL LIGATION     UPPER GASTROINTESTINAL ENDOSCOPY     Family History  Problem Relation Age of Onset   Hypertension Mother    Alzheimer's disease Father    Diabetes Father    Colon cancer Neg Hx    Rectal cancer Neg Hx    Stomach cancer Neg Hx    Colon polyps Neg Hx    Crohn's disease Neg Hx    Esophageal cancer Neg Hx    Social History   Occupational History   Not on file  Tobacco Use   Smoking status:  Never    Passive exposure: Never   Smokeless tobacco: Never  Vaping Use   Vaping status: Never Used  Substance and Sexual Activity   Alcohol use: Never   Drug use: Never   Sexual activity: Not Currently    Birth control/protection: Post-menopausal   Tobacco Counseling Counseling given: Not Answered  SDOH Screenings   Food Insecurity: Food Insecurity Present (01/30/2024)  Housing: High Risk (01/30/2024)  Transportation Needs: No Transportation Needs (01/30/2024)  Utilities: Not At Risk (01/30/2024)  Alcohol Screen: Low Risk (01/30/2024)  Depression (PHQ2-9): Low Risk (01/30/2024)  Financial Resource Strain: High Risk (01/30/2024)  Physical Activity: Inactive (01/30/2024)  Social Connections: Moderately Integrated (01/30/2024)  Stress: No Stress Concern Present (01/30/2024)  Tobacco Use: Low Risk (01/30/2024)  Health Literacy: Adequate Health Literacy (01/30/2024)   See flowsheets for full screening details  Depression Screen PHQ 2 & 9 Depression Scale- Over the past 2 weeks, how often have you been bothered by any of the following problems? Little interest or pleasure in doing things: 0 Feeling down, depressed, or hopeless (PHQ Adolescent also includes...irritable): 0 PHQ-2 Total Score:  0 Trouble falling or staying asleep, or sleeping too much: 0 Feeling tired or having little energy: 1 Poor appetite or overeating (PHQ Adolescent also includes...weight loss): 0 Feeling bad about yourself - or that you are a failure or have let yourself or your family down: 0 Trouble concentrating on things, such as reading the newspaper or watching television (PHQ Adolescent also includes...like school work): 0 Moving or speaking so slowly that other people could have noticed. Or the opposite - being so fidgety or restless that you have been moving around a lot more than usual: 3 Thoughts that you would be better off dead, or of hurting yourself in some way: 0 PHQ-9 Total Score: 4 If you  checked off any problems, how difficult have these problems made it for you to do your work, take care of things at home, or get along with other people?: Not difficult at all     Goals Addressed             This Visit's Progress    01/30/2024: To get back into physical fitness and join a gym.               Objective:    Today's Vitals   01/30/24 0955  Weight: 136 lb (61.7 kg)  Height: 5' (1.524 m)  PainSc: 0-No pain  PainLoc: Toe   Body mass index is 26.56 kg/m.  Hearing/Vision screen No results found. Immunizations and Health Maintenance Health Maintenance  Topic Date Due   Zoster Vaccines- Shingrix (1 of 2) Never done   COVID-19 Vaccine (3 - 2025-26 season) 02/09/2024 (Originally 10/10/2023)   Influenza Vaccine  05/08/2024 (Originally 09/09/2023)   Medicare Annual Wellness (AWV)  01/29/2025   Mammogram  03/20/2025   DTaP/Tdap/Td (2 - Td or Tdap) 02/20/2031   Colonoscopy  08/19/2031   Pneumococcal Vaccine: 50+ Years  Completed   Bone Density Scan  Completed   Hepatitis C Screening  Completed   Meningococcal B Vaccine  Aged Out        Assessment/Plan:  This is a routine wellness examination for Kamrin.  Patient Care Team: Pruett, Milda CROME, MD as PCP - General (Family Medicine) Octavia, Charlie Hamilton, MD as Consulting Physician (Ophthalmology) Spainhour, Alm RAMAN, PA as Physician Assistant (Otolaryngology) Latanya Corean MATSU, MD as Referring Physician (Audiology) Valdemar Rogue, MD as Consulting Physician (Ophthalmology)  I have personally reviewed and noted the following in the patients chart:   Medical and social history Use of alcohol, tobacco or illicit drugs  Current medications and supplements including opioid prescriptions. Functional ability and status Nutritional status Physical activity Advanced directives List of other physicians Hospitalizations, surgeries, and ER visits in previous 12 months Vitals Screenings to include cognitive,  depression, and falls Referrals and appointments  No orders of the defined types were placed in this encounter.  In addition, I have reviewed and discussed with patient certain preventive protocols, quality metrics, and best practice recommendations. A written personalized care plan for preventive services as well as general preventive health recommendations were provided to patient.   Roz LOISE Fuller, LPN   [id number]   Return in about 1 year (around 01/29/2025) for Medicare wellness.  After Visit Summary: (MyChart) Due to this being a telephonic visit, the after visit summary with patients personalized plan was offered to patient via MyChart   Nurse Notes: Patient will start seeing Dr. Rogue Valdemar in February 2026.  HM Addressed: Patient declined vaccines.  "

## 2024-01-30 NOTE — Patient Instructions (Signed)
 Stacy Nguyen,  Thank you for taking the time for your Medicare Wellness Visit. I appreciate your continued commitment to your health goals. Please review the care plan we discussed, and feel free to reach out if I can assist you further.  Please note that Annual Wellness Visits do not include a physical exam. Some assessments may be limited, especially if the visit was conducted virtually. If needed, we may recommend an in-person follow-up with your provider.  Ongoing Care Seeing your primary care provider every 3 to 6 months helps us  monitor your health and provide consistent, personalized care.   Referrals If a referral was made during today's visit and you haven't received any updates within two weeks, please contact the referred provider directly to check on the status.  Recommended Screenings:  Health Maintenance  Topic Date Due   Zoster (Shingles) Vaccine (1 of 2) Never done   Medicare Annual Wellness Visit  10/25/2023   COVID-19 Vaccine (3 - 2025-26 season) 02/09/2024*   Flu Shot  05/08/2024*   Breast Cancer Screening  03/20/2025   DTaP/Tdap/Td vaccine (2 - Td or Tdap) 02/20/2031   Colon Cancer Screening  08/19/2031   Pneumococcal Vaccine for age over 35  Completed   Osteoporosis screening with Bone Density Scan  Completed   Hepatitis C Screening  Completed   Meningitis B Vaccine  Aged Out  *Topic was postponed. The date shown is not the original due date.       01/30/2024   10:00 AM  Advanced Directives  Does Patient Have a Medical Advance Directive? Yes  Type of Estate Agent of [place];Living will  Does patient want to make changes to medical advance directive? No - Patient declined  Copy of Healthcare Power of Attorney in Chart? No - copy requested    Vision: Annual vision screenings are recommended for early detection of glaucoma, cataracts, and diabetic retinopathy. These exams can also reveal signs of chronic conditions such as diabetes and  high blood pressure.  Dental: Annual dental screenings help detect early signs of oral cancer, gum disease, and other conditions linked to overall health, including heart disease and diabetes.  Please see the attached documents for additional preventive care recommendations.

## 2024-01-31 ENCOUNTER — Encounter: Payer: Self-pay | Admitting: Rehabilitative and Restorative Service Providers"

## 2024-01-31 ENCOUNTER — Ambulatory Visit

## 2024-01-31 DIAGNOSIS — M25552 Pain in left hip: Secondary | ICD-10-CM | POA: Diagnosis not present

## 2024-01-31 DIAGNOSIS — M6281 Muscle weakness (generalized): Secondary | ICD-10-CM | POA: Diagnosis not present

## 2024-01-31 DIAGNOSIS — M5459 Other low back pain: Secondary | ICD-10-CM | POA: Diagnosis not present

## 2024-01-31 DIAGNOSIS — M79672 Pain in left foot: Secondary | ICD-10-CM

## 2024-01-31 DIAGNOSIS — M79671 Pain in right foot: Secondary | ICD-10-CM

## 2024-01-31 DIAGNOSIS — R262 Difficulty in walking, not elsewhere classified: Secondary | ICD-10-CM | POA: Diagnosis not present

## 2024-01-31 NOTE — Therapy (Addendum)
 " OUTPATIENT PHYSICAL THERAPY TREATMENT   Patient Name: Stacy Nguyen MRN: [id number] DOB:19-Jun-1955, 68 y.o., female Today's Date: 01/31/2024  END OF SESSION:  PT End of Session - 01/31/24 1132     Visit Number 3    Number of Visits 20    Date for Recertification  03/28/24    Authorization Type [employer] $20 copay    Progress Note Due on Visit 10    PT Start Time 1056    PT Stop Time 1137    PT Time Calculation (min) 41 min    Activity Tolerance Patient tolerated treatment well    Behavior During Therapy WFL for tasks assessed/performed            Past Medical History:  Diagnosis Date   Allergy    Anemia    as achild   Anxiety    Arthritis    all over   Cataract    bilateral - MD just watching   Depression    Hyperlipidemia    Hypertension    Type 2 diabetes mellitus (HCC)    Vertigo 08/03/2021   Past Surgical History:  Procedure Laterality Date   [place]OS[place]     greater than [place]rs [place]   SHOULDER SURGERY Left 1986   TUBAL LIGATION     UPPER GASTROINTESTINAL ENDOSCOPY     Patient Active Problem List   Diagnosis Date Noted   Snoring 08/19/2022   Depression, major, single episode, mild 04/28/2022   Osteopenia 04/27/2022   Sensorineural hearing loss (SNHL) of both ears 02/16/2022   Vertigo 08/03/2021   Prediabetes 02/19/2021   Hyperlipidemia 10/12/2018   HTN (hypertension) 08/25/2018    PCP: Orie Milda CROME, MD  REFERRING PROVIDER: Orie Milda CROME, MD  REFERRING DIAG: G89.29,M54.42 (ICD-10-CM) - Chronic bilateral low back pain with left-sided sciatica  Rationale for Evaluation and Treatment: Rehabilitation  THERAPY DIAG:  Other low back pain  Pain in left hip  Muscle weakness (generalized)  Difficulty in walking, not elsewhere classified  Pain in right foot  Pain in left foot  ONSET DATE: Chronic worsening over last 3-4 months.   SUBJECTIVE:                                                                                                                                                                                            SUBJECTIVE STATEMENT: Pt indicated having some soreness in hips after last visit but for a day.  Reported having less pain complaints overall.   PERTINENT HISTORY:  Medical history of anemia, anxiety, arthritis, depression, hyperlipidemia, HTN, DM2, vertigo. Concurrent history of Rt  foot pain.  Also indicate broken toe in Lt foot on Thanksgiving.      Pt came to clinic c complaints of back, lt hip pain. Pt indicated insidious worsening of symptoms  Pt indicated difficulty with bending, twisting, weight on Lt leg.  Reported pain across back and Lt hip/thigh.  Reported difficulty in walking, transfers due to symptoms.  Pt indicated no numbness/tingling.  Pt indicated symptoms have worsened over time.   Pt indicated some difficulty sleeping due to symptoms.    PAIN:  NPRS scale: 4/10 at most Pain location: back, Lt hip pain/thigh pain and into lateral lower leg at times.  Pain description: tight, cramp, constant Aggravating factors: WB on Lt leg, transfers, walking, bending, twisting.  Relieving factors: topical gel.   PRECAUTIONS: None  WEIGHT BEARING RESTRICTIONS: No  FALLS:  Has patient fallen in last 6 months? 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 Mild degenerative joint changes of lumbar spine.  PATIENT SURVEYS:  Patient-Specific Activity Scoring Scheme  0 represents unable to perform. 10 represents able to perform at prior level. 0 1 2 3  4 5 6 7 8 9 10  (Date and Score)   Activity Eval  01/18/2024    1. walking 2     2. Bending  2    3. Bed mobility 2   4. Twisting at waist 2   5. stairs 2   Score 2 avg    Total score = sum of the activity scores/number of activities Minimum detectable change (90%CI) for average score = 2 points Minimum detectable change (90%CI) for single activity score = 3 points  SCREENING FOR RED FLAGS: 01/18/2024 Bowel or bladder incontinence: No Cauda equina syndrome: No  COGNITION: 01/18/2024 Overall cognitive status: WFL normal      SENSATION: 01/18/2024 [facility]  MUSCLE LENGTH: 01/18/2024 Passive SLR Lt 40 deg with back/hip pain Passive SLR Rt 60 deg with Rt leg tightness  POSTURE:  01/18/2024 Standing posture offtset with trunk lean to Rt due to walking shoe on Lt.  Provided shoe lift in Rt shoe to help offset. Analysis in standing without walking shoe showed unremarkable hip iliac crest alignment.  Reduced lumbar lordosis noted in standing.   PALPATION: 01/18/2024 Tenderness to light touch in Lt QL, Lt paraspinals, Lt glute max/med/min.  Trigger points noted.   LUMBAR ROM:  01/18/2024 Directional Preference Assessment: Centralization: not noted Peripheralization:  not noted  AROM Eval 01/18/2024 01/31/2024  Flexion To mid thigh with pianful arc, gower's sign upon return   Extension 50% WFL low back pain 75% WFL  Standing x 5 improved to 100%   Right lateral flexion    Left lateral flexion    Right rotation    Left rotation     (Blank rows = not tested)  LOWER EXTREMITY ROM:      Right Eval 01/18/2024 Left Eval 01/18/2024 01/25/2024 Right 01/31/2024 Left 01/31/2024  Hip flexion   Rt AROM: 85deg Rt PROM: 105deg  Lt AROM: 74deg Lt PROM: 94deg    Hip extension       Hip abduction  Hip adduction       Hip internal rotation       Hip external rotation    54 PROM in 90 deg hip flexion 60 PROM in 90 deg hip flexion  Knee flexion       Knee extension        Ankle dorsiflexion       Ankle plantarflexion       Ankle inversion       Ankle eversion        (Blank rows = not tested)  LOWER EXTREMITY MMT:    MMT Right Eval 01/18/2024 Left Eval 01/18/2024  Hip flexion 5/5 4/5  Hip extension    Hip abduction 3+/5 2+/5  Hip adduction    Hip internal rotation    Hip external rotation    Knee flexion 4/5 4/5  Knee extension 5/5 4/5  Ankle dorsiflexion    Ankle plantarflexion    Ankle inversion    Ankle eversion     (Blank rows = not tested)  LUMBAR SPECIAL TESTS:  01/18/2024 (+) Lt slump for increased tightness in posterior leg compared to Rt.  (-) crossed SLR bilaterally   FUNCTIONAL TESTS:  01/18/2024 No specific testing today.   01/25/2024 TUG 22.56s   GAIT: 01/18/2024 Independent ambulation with walking shoe on Lt foot due to toe fracture. Antalgic gait noted due to it.                                                                                                                                                                                                                    TODAY'S TREATMENT:                                                                                                         DATE:  01/31/2024 Therex: Nustep lvl 4 8 mins for ROM, endurance Supine bridge 2 x 10 Supine figure 4 pull towards to tolerance 30 sec x 3 bilaterally  Supine hooklying clam shell green band 2 x 10 bilateral Cues given for techniques of intervention.  Cues given to stretch to tightness but no pain increase levels.     TODAY'S TREATMENT:                                                                                                         DATE:  01/25/2024  TherEx:  Nustep with bilat UE/LE level 3 for 8 minutes  AROM/PROM for hip performed and noted above  PPT 1x10 with 2s holds  Supine figure 4 2x30s each side  PT assisted for Lt side stretch as patient is highly painful   Physical Performance  TUG with results  noted above   Manual:  Attempted STM to Lt glutes/piriformis, though discontinued secondary to high levels of pain    TODAY'S TREATMENT:                                                                                                         DATE:  01/18/2024  Therex:    HEP instruction/performance c cues for techniques, handout provided.  Trial set performed of each for comprehension and symptom assessment.  See below for exercise list  TherActivity Education and trials of supine to sit to supine transfers c focus on log rolling techniques.  Verbal, visual and tactile cues performed during activity to help improve techniques.   PATIENT EDUCATION:  01/18/2024 Education details: HEP, POC Person educated: Patient Education method: Programmer, Multimedia, Demonstration, Verbal cues, and Handouts Education comprehension: verbalized understanding, returned demonstration, and verbal cues required  HOME EXERCISE PROGRAM: Access Code: [email or web address] URL: [email or web address] Date: 01/18/2024 Prepared by: Ozell Silvan  Exercises - Supine Lower Trunk Rotation  - 2-3 x daily - 7 x weekly - 1 sets - 3-5 reps - 15 hold - Standing Lumbar Extension  - 2-3 x daily - 7 x weekly - 1 sets - 5-10 reps - Supine Bridge  - 1-2 x daily - 7 x weekly - 1-2 sets - 10 reps - 2 hold - Clamshell  - 1-2 x daily - 7 x weekly - 2-3 sets - 10-15 reps  ASSESSMENT:  CLINICAL IMPRESSION: Pt asked for check of inspection of toe.  Visual inspection revealed no wound and minimal bruising noted.   Lt hip ER mildly limited compared to Rt.  Continued lumbar/hip mobility gains and general strengthening to help improve ability to perform movement.   OBJECTIVE IMPAIRMENTS: Abnormal gait, decreased activity tolerance, decreased balance, decreased coordination, decreased endurance, decreased mobility, difficulty walking, decreased ROM, decreased strength, increased fascial restrictions, impaired perceived functional ability,  increased muscle spasms, impaired flexibility, improper body mechanics, postural dysfunction, and pain.   ACTIVITY LIMITATIONS: carrying, lifting, bending, sitting, standing, squatting, sleeping,  stairs, transfers, bed mobility, bathing, dressing, reach over head, hygiene/grooming, and locomotion level  PARTICIPATION LIMITATIONS: meal prep, cleaning, laundry, interpersonal relationship, driving, shopping, and community activity  PERSONAL FACTORS: Medical history of anemia, anxiety, arthritis, depression, hyperlipidemia, HTN, DM2, vertigo, multiple pain areas, length of time since onset/severity of symptoms  are also affecting patient's functional outcome.   REHAB POTENTIAL: Good  CLINICAL DECISION MAKING: Evolving/moderate complexity  EVALUATION COMPLEXITY: Moderate   GOALS: Goals reviewed with patient? Yes  SHORT TERM GOALS: (target date for Short term goals are 3 weeks 02/08/2024)  1. Patient will demonstrate independent use of home exercise program to maintain progress from in clinic treatments.  Goal status: on going 01/31/2024  LONG TERM GOALS: (target dates for all long term goals are 10 weeks  03/28/2024 )   1. Patient will demonstrate/report pain at worst less than or equal to 2/10 to facilitate minimal limitation in daily activity secondary to pain symptoms.  Goal status: New   2. Patient will demonstrate independent use of home exercise program to facilitate ability to maintain/progress functional gains from skilled physical therapy services.  Goal status: New   3. Patient will demonstrate Patient specific functional scale avg > or = 8/10 to indicate reduced disability due to condition.   Goal status: New   4. Patient will demonstrate lumbar extension 100 % WFL s symptoms to facilitate upright standing, walking posture at PLOF s limitation.  Goal status: New   5.  Patient will demonstrate bilateral hip MMT 4/5 or greater for abduction, 5/5 for flexion, knee  extension /flexion 5/5 bilateral for usual transfers, stairs and ambulation.   Goal status: New   6.  Patient will demonstrate ascending/descending stairs reciprocally s UE assist for community integration.   Goal status: New   7.  Patient will demonstrate passive SLR bilaterally to > or = 70 deg to facilitate usual mobility at PLOF.  Goal Status: New  PLAN:  PT FREQUENCY: 1-2x/week  PT DURATION: 10 weeks  PLANNED INTERVENTIONS: Can include 02853- PT Re-evaluation, 97110-Therapeutic exercises, 97530- Therapeutic activity, [id number]- Neuromuscular re-education, 97535- Self Care, 97140- Manual therapy, [phone]- Gait training, [phone]- Orthotic Fit/training, [phone]- Canalith repositioning, [id number]- Aquatic Therapy, [phone]- Electrical stimulation (unattended), [id number] Physical performance testing, 97016- Vasopneumatic device, [id number]- Ultrasound, [id number]- Traction (mechanical), [id number]- Ionotophoresis 4mg /ml Dexamethasone,  20560 - Needle insertion w/o injection 1 or 2 muscles, 20561 - Needle insertion w/o injection 3 or more muscles.    Patient/Family education, Balance training, Stair training, Taping, Dry Needling, Joint mobilization, Joint manipulation, Spinal manipulation, Spinal mobilization, Scar mobilization, Vestibular training, Visual/preceptual remediation/compensation, DME instructions, Cryotherapy, and Moist heat.  All performed as medically necessary.  All included unless contraindicated  PLAN FOR NEXT SESSION: Mobility gains for lumbar/hip, general strengthening.   Ozell Silvan, PT, DPT, OCS, ATC 02/16/2024  7:50 AM      Date of referral: 12/30/2023 Referring provider: Orie Milda CROME, MD Referring diagnosis? G89.29,M54.42 (ICD-10-CM) - Chronic bilateral low back pain with left-sided sciatica Treatment diagnosis? (if different than referring diagnosis) M54.59, M25.552, M62.81, R26.2, M79.671, F20.327  What was this (referring dx) caused by? Ongoing Issue  Lysle of Condition: Chronic  (continuous duration > 3 months)   Laterality: Both bilateral back, Lt hip  Current Functional Measure Score: Patient Specific Functional Scale eval avg:   Objective measurements identify impairments when they are compared to normal values, the uninvolved extremity, and prior level of function.  [x]  Yes  []  No  Objective assessment of functional ability: Severe functional limitations  Briefly describe symptoms: Pt came to clinic c complaints of back, lt hip pain. Pt indicated insidious worsening of symptoms  Pt indicated difficulty with bending, twisting, weight on Lt leg.  Reported pain across back and Lt hip/thigh.  Reported difficulty in walking, transfers due to symptoms.  Pt indicated no numbness/tingling.  Pt indicated symptoms have worsened over time.   Pt indicated some difficulty sleeping due to symptoms.   How did symptoms start: Insidious worsening over time.   Average pain intensity:  Last 24 hours: 9/10 at worst  Past week: 9/10 at worst  How often does the pt experience symptoms? Constantly  How much have the symptoms interfered with usual daily activities? Extremely  How has condition changed since care began at this facility? NA - initial visit  In general, how is the patients overall health? Good   BACK PAIN (STarT Back Screening Tool) Has pain spread down the leg(s) at some time in the last 2 weeks? yes Has there been pain in the shoulder or neck at some time in the last 2 weeks? no Has the pt only walked short distances because of back pain? yes Has patient dressed more slowly because of back pain in the past 2 weeks? yes Does patient think it's not safe for a person with this condition to be physically active? no Does patient have worrying thoughts a lot of the time? yes Does patient feel back pain is terrible and will never get any better? no Has patient stopped enjoying things they usually enjoy? yes Overall, how bothersome has back pain been in the last 2  weeks?                    Very Much  "

## 2024-02-07 ENCOUNTER — Ambulatory Visit: Admitting: Rehabilitative and Restorative Service Providers"

## 2024-02-07 ENCOUNTER — Encounter: Payer: Self-pay | Admitting: Rehabilitative and Restorative Service Providers"

## 2024-02-07 DIAGNOSIS — R262 Difficulty in walking, not elsewhere classified: Secondary | ICD-10-CM

## 2024-02-07 DIAGNOSIS — M79671 Pain in right foot: Secondary | ICD-10-CM | POA: Diagnosis not present

## 2024-02-07 DIAGNOSIS — M25552 Pain in left hip: Secondary | ICD-10-CM

## 2024-02-07 DIAGNOSIS — M6281 Muscle weakness (generalized): Secondary | ICD-10-CM

## 2024-02-07 DIAGNOSIS — M79672 Pain in left foot: Secondary | ICD-10-CM | POA: Diagnosis not present

## 2024-02-07 DIAGNOSIS — M5459 Other low back pain: Secondary | ICD-10-CM | POA: Diagnosis not present

## 2024-02-07 NOTE — Therapy (Addendum)
 " OUTPATIENT PHYSICAL THERAPY TREATMENT   Patient Name: Stacy Nguyen MRN: [id number] DOB:[birth year], 68 y.o., female Today's Date: 02/07/2024  END OF SESSION:  PT End of Session - 02/07/24 1110     Visit Number 4    Number of Visits 20    Date for Recertification  03/28/24    Authorization Type [employer] $20 copay    Authorization Time Period 20 visits 01/18/2024  through 03/28/2024    Authorization - Visit Number 4    Authorization - Number of Visits 20    Progress Note Due on Visit 10    PT Start Time 1057    PT Stop Time 1138    PT Time Calculation (min) 41 min    Activity Tolerance Patient tolerated treatment well    Behavior During Therapy WFL for tasks assessed/performed             Past Medical History:  Diagnosis Date   Allergy    Anemia    as achild   Anxiety    Arthritis    all over   Cataract    bilateral - MD just watching   Depression    Hyperlipidemia    Hypertension    Type 2 diabetes mellitus (HCC)    Vertigo 08/03/2021   Past Surgical History:  Procedure Laterality Date   [place]OS[place]     greater than [place]rs [place]   SHOULDER SURGERY Left 1986   TUBAL LIGATION     UPPER GASTROINTESTINAL ENDOSCOPY     Patient Active Problem List   Diagnosis Date Noted   Snoring 08/19/2022   Depression, major, single episode, mild 04/28/2022   Osteopenia 04/27/2022   Sensorineural hearing loss (SNHL) of both ears 02/16/2022   Vertigo 08/03/2021   Prediabetes 02/19/2021   Hyperlipidemia 10/12/2018   HTN (hypertension) 08/25/2018    PCP: Orie Milda CROME, MD  REFERRING PROVIDER: Orie Milda CROME, MD  REFERRING DIAG: G89.29,M54.42 (ICD-10-CM) - Chronic bilateral low back pain with left-sided sciatica  Rationale for Evaluation and Treatment: Rehabilitation  THERAPY DIAG:  Other low back pain  Pain in left hip  Muscle weakness (generalized)  Difficulty in walking, not elsewhere classified  Pain in right foot  Pain in left  foot  ONSET DATE: Chronic worsening over last 3-4 months.   SUBJECTIVE:                                                                                                                                                                                           SUBJECTIVE STATEMENT: Pt indicated a little pain upon arrival today.  Reported having  some soreness increase after exercise. Noted some trouble with bridge activity.   Pt asked about gym return and if that would  be helpful.    PERTINENT HISTORY:  Medical history of anemia, anxiety, arthritis, depression, hyperlipidemia, HTN, DM2, vertigo. Concurrent history of Rt foot pain.  Also indicate broken toe in Lt foot on Thanksgiving.      Pt came to clinic c complaints of back, lt hip pain. Pt indicated insidious worsening of symptoms  Pt indicated difficulty with bending, twisting, weight on Lt leg.  Reported pain across back and Lt hip/thigh.  Reported difficulty in walking, transfers due to symptoms.  Pt indicated no numbness/tingling.  Pt indicated symptoms have worsened over time.   Pt indicated some difficulty sleeping due to symptoms.    PAIN:  NPRS scale: a little Pain location: back, Lt hip pain/thigh pain and into lateral lower leg at times.  Pain description: tight, cramp, constant Aggravating factors: WB on Lt leg, transfers, walking, bending, twisting.  Relieving factors: topical gel.   PRECAUTIONS: None  WEIGHT BEARING RESTRICTIONS: No  FALLS:  Has patient fallen in last 6 months? 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 Mild degenerative joint changes of lumbar spine.  PATIENT SURVEYS:  Patient-Specific Activity Scoring Scheme  0 represents unable to perform. 10 represents able to perform at prior level. 0 1 2 3 4 5 6 7 8 9  10 (Date and Score)   Activity Eval  01/18/2024    1. walking 2     2. Bending  2    3. Bed mobility 2   4. Twisting at waist 2   5. stairs 2   Score 2 avg    Total score = sum of the activity scores/number of activities Minimum detectable change (90%CI) for average score = 2 points Minimum detectable change (90%CI) for single activity score = 3 points  SCREENING FOR RED FLAGS: 01/18/2024 Bowel or bladder incontinence: No Cauda equina syndrome: No  COGNITION: 01/18/2024 Overall cognitive status: WFL normal      SENSATION: 01/18/2024 [facility]  MUSCLE LENGTH: 01/18/2024 Passive SLR Lt 40 deg with back/hip pain Passive SLR Rt 60 deg with Rt leg tightness  POSTURE:  01/18/2024 Standing posture offtset with trunk lean to Rt due to walking shoe on Lt.  Provided shoe lift in Rt shoe to help offset. Analysis in standing without walking shoe showed unremarkable hip iliac crest alignment.  Reduced lumbar lordosis noted in standing.   PALPATION: 01/18/2024 Tenderness to light touch in Lt QL, Lt paraspinals, Lt glute max/med/min.  Trigger points noted.   LUMBAR ROM:  01/18/2024 Directional Preference Assessment: Centralization: not noted Peripheralization:  not noted  AROM Eval 01/18/2024 01/31/2024 02/07/2024  Flexion To mid thigh with pianful arc, gower's sign upon return  To mid shin, gowers sign up return.   Extension 50% WFL low back pain 75% WFL  Standing x 5 improved to 100%  100 % WFL  Right lateral flexion     Left lateral flexion     Right rotation     Left rotation      (  Blank rows = not tested)  LOWER EXTREMITY ROM:      Right Eval 01/18/2024 Left Eval 01/18/2024 01/25/2024 Right 01/31/2024 Left 01/31/2024   Hip flexion   Rt AROM: 85deg Rt PROM: 105deg  Lt AROM: 74deg Lt PROM: 94deg    Hip extension       Hip abduction       Hip adduction       Hip internal rotation       Hip external rotation    54 PROM in 90 deg hip flexion 60 PROM in 90 deg hip flexion  Knee flexion       Knee extension       Ankle dorsiflexion       Ankle plantarflexion       Ankle inversion       Ankle eversion        (Blank rows = not tested)  LOWER EXTREMITY MMT:    MMT Right Eval 01/18/2024 Left Eval 01/18/2024  Hip flexion 5/5 4/5  Hip extension    Hip abduction 3+/5 2+/5  Hip adduction    Hip internal rotation    Hip external rotation    Knee flexion 4/5 4/5  Knee extension 5/5 4/5  Ankle dorsiflexion    Ankle plantarflexion    Ankle inversion    Ankle eversion     (Blank rows = not tested)  LUMBAR SPECIAL TESTS:  01/18/2024 (+) Lt slump for increased tightness in posterior leg compared to Rt.  (-) crossed SLR bilaterally   FUNCTIONAL TESTS:  01/25/2024 TUG 22.56s   01/18/2024 No specific testing today.   GAIT: 01/18/2024 Independent ambulation with walking shoe on Lt foot due to toe fracture. Antalgic gait noted due to it.                                                                                                                                                                                                                    TODAY'S TREATMENT:  DATE:  02/07/2024 Therex: Nustep lvl 5 10 mins for ROM, endurance Continued review of HEP techniques and adjust distances accordingly based off symptoms. Supine hooklying trunk rotation 15 sec x 3 bilaterally  Supine hooklying bridge 2 sec hold 2 x 10  Supine figure 4 pull towards to tolerance 30 sec x 3 bilaterally  Supine hip extension into table 5 sec hold x 10, performed bilaterally     TODAY'S TREATMENT:                                                                                                          DATE:  01/31/2024 Therex: Nustep lvl 4 8 mins for ROM, endurance Supine bridge 2 x 10 Supine figure 4 pull towards to tolerance 30 sec x 3 bilaterally  Supine hooklying clam shell green band 2 x 10 bilateral Cues given for techniques of intervention.  Cues given to stretch to tightness but no pain increase levels.     TODAY'S TREATMENT:                                                                                                         DATE:  01/25/2024  TherEx:  Nustep with bilat UE/LE level 3 for 8 minutes  AROM/PROM for hip performed and noted above  PPT 1x10 with 2s holds  Supine figure 4 2x30s each side  PT assisted for Lt side stretch as patient is highly painful   Physical Performance  TUG with results noted above   Manual:  Attempted STM to Lt glutes/piriformis, though discontinued secondary to high levels of pain    PATIENT EDUCATION:  01/18/2024 Education details: HEP, POC Person educated: Patient Education method: Programmer, Multimedia, Demonstration, Verbal cues, and Handouts Education comprehension: verbalized understanding, returned demonstration, and verbal cues required  HOME EXERCISE PROGRAM: Access Code: [email or web address] URL: [email or web address] Date: 01/18/2024 Prepared by: Ozell Silvan  Exercises - Supine Lower Trunk Rotation  - 2-3 x daily - 7 x weekly - 1 sets - 3-5 reps - 15 hold - Standing Lumbar Extension  - 2-3 x daily - 7 x weekly - 1 sets - 5-10 reps - Supine Bridge  - 1-2 x daily - 7 x weekly - 1-2 sets - 10 reps - 2 hold - Clamshell  - 1-2 x daily - 7 x weekly - 2-3 sets - 10-15 reps  ASSESSMENT:  CLINICAL IMPRESSION: Reassessment of lumbar movement showed improvement compared to previous updates.  Continued complaints of difficulty/pain with prolonged standing/walking activity.  Continued skilled PT services indicated at this time.   OBJECTIVE  IMPAIRMENTS: Abnormal gait, decreased activity tolerance, decreased balance, decreased coordination, decreased  endurance, decreased mobility, difficulty walking, decreased ROM, decreased strength, increased fascial restrictions, impaired perceived functional ability, increased muscle spasms, impaired flexibility, improper body mechanics, postural dysfunction, and pain.   ACTIVITY LIMITATIONS: carrying, lifting, bending, sitting, standing, squatting, sleeping, stairs, transfers, bed mobility, bathing, dressing, reach over head, hygiene/grooming, and locomotion level  PARTICIPATION LIMITATIONS: meal prep, cleaning, laundry, interpersonal relationship, driving, shopping, and community activity  PERSONAL FACTORS: Medical history of anemia, anxiety, arthritis, depression, hyperlipidemia, HTN, DM2, vertigo, multiple pain areas, length of time since onset/severity of symptoms  are also affecting patient's functional outcome.   REHAB POTENTIAL: Good  CLINICAL DECISION MAKING: Evolving/moderate complexity  EVALUATION COMPLEXITY: Moderate   GOALS: Goals reviewed with patient? Yes  SHORT TERM GOALS: (target date for Short term goals are 3 weeks 02/08/2024)  1. Patient will demonstrate independent use of home exercise program to maintain progress from in clinic treatments.  Goal status: partially met 02/07/2024  LONG TERM GOALS: (target dates for all long term goals are 10 weeks  03/28/2024 )   1. Patient will demonstrate/report pain at worst less than or equal to 2/10 to facilitate minimal limitation in daily activity secondary to pain symptoms.  Goal status: New   2. Patient will demonstrate independent use of home exercise program to facilitate ability to maintain/progress functional gains from skilled physical therapy services.  Goal status: New   3. Patient will demonstrate Patient specific functional scale avg > or = 8/10 to indicate reduced disability due to condition.   Goal status:  New   4. Patient will demonstrate lumbar extension 100 % WFL s symptoms to facilitate upright standing, walking posture at PLOF s limitation.  Goal status: New   5.  Patient will demonstrate bilateral hip MMT 4/5 or greater for abduction, 5/5 for flexion, knee extension /flexion 5/5 bilateral for usual transfers, stairs and ambulation.   Goal status: New   6.  Patient will demonstrate ascending/descending stairs reciprocally s UE assist for community integration.   Goal status: New   7.  Patient will demonstrate passive SLR bilaterally to > or = 70 deg to facilitate usual mobility at PLOF.  Goal Status: New  PLAN:  PT FREQUENCY: 1-2x/week  PT DURATION: 10 weeks  PLANNED INTERVENTIONS: Can include 02853- PT Re-evaluation, 97110-Therapeutic exercises, 97530- Therapeutic activity, [id number]- Neuromuscular re-education, 97535- Self Care, 97140- Manual therapy, [phone]- Gait training, [phone]- Orthotic Fit/training, [phone]- Canalith repositioning, [id number]- Aquatic Therapy, [phone]- Electrical stimulation (unattended), [id number] Physical performance testing, 97016- Vasopneumatic device, [id number]- Ultrasound, [id number]- Traction (mechanical), [id number]- Ionotophoresis 4mg /ml Dexamethasone,  20560 - Needle insertion w/o injection 1 or 2 muscles, 20561 - Needle insertion w/o injection 3 or more muscles.    Patient/Family education, Balance training, Stair training, Taping, Dry Needling, Joint mobilization, Joint manipulation, Spinal manipulation, Spinal mobilization, Scar mobilization, Vestibular training, Visual/preceptual remediation/compensation, DME instructions, Cryotherapy, and Moist heat.  All performed as medically necessary.  All included unless contraindicated  PLAN FOR NEXT SESSION: Mobility gains and strengthening as able.   Ozell Silvan, PT, DPT, OCS, ATC 02/16/2024  7:51 AM     Date of referral: 12/30/2023 Referring provider: Orie Milda CROME, MD Referring diagnosis? G89.29,M54.42 (ICD-10-CM) -  Chronic bilateral low back pain with left-sided sciatica Treatment diagnosis? (if different than referring diagnosis) M54.59, M25.552, M62.81, R26.2, M79.671, F20.327  What was this (referring dx) caused by? Ongoing Issue  Lysle of Condition: Chronic (continuous duration > 3 months)   Laterality: Both bilateral back, Lt hip  Current Functional Measure Score: Patient Specific Functional Scale eval  avg:   Objective measurements identify impairments when they are compared to normal values, the uninvolved extremity, and prior level of function.  [x]  Yes  []  No  Objective assessment of functional ability: Severe functional limitations   Briefly describe symptoms: Pt came to clinic c complaints of back, lt hip pain. Pt indicated insidious worsening of symptoms  Pt indicated difficulty with bending, twisting, weight on Lt leg.  Reported pain across back and Lt hip/thigh.  Reported difficulty in walking, transfers due to symptoms.  Pt indicated no numbness/tingling.  Pt indicated symptoms have worsened over time.   Pt indicated some difficulty sleeping due to symptoms.   How did symptoms start: Insidious worsening over time.   Average pain intensity:  Last 24 hours: 9/10 at worst  Past week: 9/10 at worst  How often does the pt experience symptoms? Constantly  How much have the symptoms interfered with usual daily activities? Extremely  How has condition changed since care began at this facility? NA - initial visit  In general, how is the patients overall health? Good   BACK PAIN (STarT Back Screening Tool) Has pain spread down the leg(s) at some time in the last 2 weeks? yes Has there been pain in the shoulder or neck at some time in the last 2 weeks? no Has the pt only walked short distances because of back pain? yes Has patient dressed more slowly because of back pain in the past 2 weeks? yes Does patient think it's not safe for a person with this condition to be physically active?  no Does patient have worrying thoughts a lot of the time? yes Does patient feel back pain is terrible and will never get any better? no Has patient stopped enjoying things they usually enjoy? yes Overall, how bothersome has back pain been in the last 2 weeks?                    Very Much  "

## 2024-02-08 ENCOUNTER — Other Ambulatory Visit: Payer: Self-pay | Admitting: *Deleted

## 2024-02-08 ENCOUNTER — Other Ambulatory Visit: Payer: Self-pay | Admitting: Family Medicine

## 2024-02-08 DIAGNOSIS — Z1231 Encounter for screening mammogram for malignant neoplasm of breast: Secondary | ICD-10-CM

## 2024-02-09 MED ORDER — CETIRIZINE HCL 10 MG PO TABS: 10.0000 mg | ORAL_TABLET | Freq: Every day | ORAL | 11 refills | Status: AC

## 2024-02-10 ENCOUNTER — Encounter: Admitting: Rehabilitative and Restorative Service Providers"

## 2024-02-14 ENCOUNTER — Encounter

## 2024-02-16 ENCOUNTER — Encounter: Payer: Self-pay | Admitting: Rehabilitative and Restorative Service Providers"

## 2024-02-16 ENCOUNTER — Ambulatory Visit (INDEPENDENT_AMBULATORY_CARE_PROVIDER_SITE_OTHER): Admitting: Rehabilitative and Restorative Service Providers"

## 2024-02-16 DIAGNOSIS — R262 Difficulty in walking, not elsewhere classified: Secondary | ICD-10-CM | POA: Diagnosis not present

## 2024-02-16 DIAGNOSIS — M79672 Pain in left foot: Secondary | ICD-10-CM | POA: Diagnosis not present

## 2024-02-16 DIAGNOSIS — M6281 Muscle weakness (generalized): Secondary | ICD-10-CM | POA: Diagnosis not present

## 2024-02-16 DIAGNOSIS — M79671 Pain in right foot: Secondary | ICD-10-CM

## 2024-02-16 DIAGNOSIS — M25552 Pain in left hip: Secondary | ICD-10-CM

## 2024-02-16 DIAGNOSIS — M5459 Other low back pain: Secondary | ICD-10-CM

## 2024-02-16 NOTE — Therapy (Signed)
 " OUTPATIENT PHYSICAL THERAPY TREATMENT   Patient Name: Stacy Nguyen MRN: [id number] DOB:[birth year], 69 y.o., female Today's Date: 02/16/2024  END OF SESSION:  PT End of Session - 02/16/24 0935     Visit Number 5    Number of Visits 20    Date for Recertification  03/28/24    Authorization Type [facility] Medicare $20 copay    Authorization Time Period 20 visits 01/18/2024  through 03/28/2024    Authorization - Number of Visits 20    Progress Note Due on Visit 10    PT Start Time 0924    PT Stop Time 1004    PT Time Calculation (min) 40 min    Activity Tolerance Patient tolerated treatment well    Behavior During Therapy [facility] for tasks assessed/performed              Past Medical History:  Diagnosis Date   Allergy    Anemia    as achild   Anxiety    Arthritis    all over   Cataract    bilateral - MD just watching   Depression    Hyperlipidemia    Hypertension    Type 2 diabetes mellitus (HCC)    Vertigo 08/03/2021   Past Surgical History:  Procedure Laterality Date   [place]OS[place]     greater than [place]rs [place]   SHOULDER SURGERY Left 1986   TUBAL LIGATION     UPPER GASTROINTESTINAL ENDOSCOPY     Patient Active Problem List   Diagnosis Date Noted   Snoring 08/19/2022   Depression, major, single episode, mild 04/28/2022   Osteopenia 04/27/2022   Sensorineural hearing loss (SNHL) of both ears 02/16/2022   Vertigo 08/03/2021   Prediabetes 02/19/2021   Hyperlipidemia 10/12/2018   HTN (hypertension) 08/25/2018    PCP: Orie Milda CROME, MD  REFERRING PROVIDER: Orie Milda CROME, MD  REFERRING DIAG: G89.29,M54.42 (ICD-10-CM) - Chronic bilateral low back pain with left-sided sciatica  Rationale for Evaluation and Treatment: Rehabilitation  THERAPY DIAG:  Other low back pain  Pain in left hip  Muscle weakness (generalized)  Difficulty in walking, not elsewhere classified  Pain in right foot  Pain in left foot  ONSET DATE: Chronic  worsening over last 3-4 months.   SUBJECTIVE:                                                                                                                                                                                           SUBJECTIVE STATEMENT: Pt indicated generally better with pain most of the time but still can hurt.   Pt indicated  going up the stairs and walking getting better.    PERTINENT HISTORY:  Medical history of anemia, anxiety, arthritis, depression, hyperlipidemia, HTN, DM2, vertigo. Concurrent history of Rt foot pain.  Also indicate broken toe in Lt foot on Thanksgiving.      Pt came to clinic c complaints of back, lt hip pain. Pt indicated insidious worsening of symptoms  Pt indicated difficulty with bending, twisting, weight on Lt leg.  Reported pain across back and Lt hip/thigh.  Reported difficulty in walking, transfers due to symptoms.  Pt indicated no numbness/tingling.  Pt indicated symptoms have worsened over time.   Pt indicated some difficulty sleeping due to symptoms.    PAIN:  NPRS scale:: no specific pain in hip upon arrival  Pain location: back, Lt hip pain/thigh pain and into lateral lower leg at times.  Pain description: tight, cramp, constant Aggravating factors: WB on Lt leg, transfers, walking, bending, twisting.  Relieving factors: topical gel.   PRECAUTIONS: None  WEIGHT BEARING RESTRICTIONS: No  FALLS:  Has patient fallen in last 6 months? 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 Mild degenerative joint changes  of lumbar spine.  PATIENT SURVEYS:  Patient-Specific Activity Scoring Scheme  0 represents unable to perform. 10 represents able to perform at prior level. 0 1 2 3 4 5 6 7 8 9  10 (Date and Score)   Activity Eval  01/18/2024  02/16/2024  1. walking 2   6  2. Bending  2  6  3. Bed mobility 2 5  4. Twisting at waist 2 6  5. stairs 2 6  Score 2 avg 5.8 avg   Total score = sum of the activity scores/number of activities Minimum detectable change (90%CI) for average score = 2 points Minimum detectable change (90%CI) for single activity score = 3 points  SCREENING FOR RED FLAGS: 01/18/2024 Bowel or bladder incontinence: No Cauda equina syndrome: No  COGNITION: 01/18/2024 Overall cognitive status: WFL normal      SENSATION: 01/18/2024 [facility]  MUSCLE LENGTH: 01/18/2024 Passive SLR Lt 40 deg with back/hip pain Passive SLR Rt 60 deg with Rt leg tightness  POSTURE:  01/18/2024 Standing posture offtset with trunk lean to Rt due to walking shoe on Lt.  Provided shoe lift in Rt shoe to help offset. Analysis in standing without walking shoe showed unremarkable hip iliac crest alignment.  Reduced lumbar lordosis noted in standing.   PALPATION: 01/18/2024 Tenderness to light touch in Lt QL, Lt paraspinals, Lt glute max/med/min.  Trigger points noted.   LUMBAR ROM:  01/18/2024 Directional Preference Assessment: Centralization: not noted Peripheralization:  not noted  AROM Eval 01/18/2024 01/31/2024 02/07/2024  Flexion To mid thigh with pianful arc, gower's sign upon return  To mid shin, gowers sign up return.   Extension 50% WFL low back pain 75% WFL  Standing x 5 improved to 100%  100 % WFL  Right lateral flexion     Left lateral flexion     Right rotation     Left rotation      (Blank rows = not tested)  LOWER EXTREMITY ROM:      Right Eval 01/18/2024 Left Eval 01/18/2024 01/25/2024 Right 01/31/2024 Left 01/31/2024  Hip flexion   Rt AROM: 85deg Rt PROM:  105deg  Lt AROM: 74deg Lt PROM: 94deg    Hip extension       Hip abduction       Hip adduction       Hip internal rotation       Hip external rotation    54 PROM in 90 deg hip flexion 60 PROM in 90 deg hip flexion  Knee flexion       Knee extension       Ankle dorsiflexion       Ankle plantarflexion       Ankle inversion       Ankle eversion        (Blank rows = not tested)  LOWER EXTREMITY MMT:    MMT Right Eval 01/18/2024 Left Eval 01/18/2024 Right 02/16/2024 Left 02/16/2024  Hip flexion 5/5 4/5 5/5 5/5  Hip extension      Hip abduction 3+/5 2+/5    Hip adduction      Hip internal rotation      Hip external rotation      Knee flexion 4/5 4/5 5/5 5/5  Knee extension 5/5 4/5 5/5 4+/5  Ankle dorsiflexion      Ankle plantarflexion      Ankle inversion      Ankle eversion       (Blank rows = not tested)  LUMBAR SPECIAL TESTS:  01/18/2024 (+) Lt slump for increased tightness in posterior leg compared to Rt.  (-) crossed SLR bilaterally   FUNCTIONAL TESTS:  02/16/2024: Tandem stance with Lt in back, good for 30 seconds Tandem stance in Rt in back, unable without adjusting to modified tandem stance (foot foward  01/25/2024 TUG 22.56s   01/18/2024 No specific testing today.   GAIT: 01/18/2024 Independent ambulation with walking shoe on Lt foot due to toe fracture. Antalgic gait noted due to it.                                                                                                                                                                                                                    TODAY'S TREATMENT:  DATE:  02/16/2024 Therex: Nustep Lvl 6 5 mins UE/LE, lvl 5 5 mins UE/LE for 10 mins total Supine trunk rotation stretch 15 sec x 3 bilaterally   Supine hooklying bridge 2-3 sec hold 2 x 10    Neuro Re-ed (muscle activation/recruitment, balance,  coordination) Modified tandem stance with foot forward but not directly in front 30 sec x 2 bilaterally with occasional HHA on bar, SBA Feet together stance on foam 1 min with SBA Supine hip extension into table 5 sec hold x 10 bilaterally   TherActivity  Cues for log rolling technique for supine to sit transfers.  Verbal cues given Sit to stand to sit with slow lowering focus from 18 inch chair 2 x 5     TODAY'S TREATMENT:                                                                                                         DATE:  02/07/2024 Therex: Nustep lvl 5 10 mins for ROM, endurance Continued review of HEP techniques and adjust distances accordingly based off symptoms. Supine hooklying trunk rotation 15 sec x 3 bilaterally  Supine hooklying bridge 2 sec hold 2 x 10  Supine figure 4 pull towards to tolerance 30 sec x 3 bilaterally  Supine hip extension into table 5 sec hold x 10, performed bilaterally     TODAY'S TREATMENT:                                                                                                         DATE:  01/31/2024 Therex: Nustep lvl 4 8 mins for ROM, endurance Supine bridge 2 x 10 Supine figure 4 pull towards to tolerance 30 sec x 3 bilaterally  Supine hooklying clam shell green band 2 x 10 bilateral Cues given for techniques of intervention.  Cues given to stretch to tightness but no pain increase levels.     TODAY'S TREATMENT:                                                                                                         DATE:  01/25/2024  TherEx:  Nustep with bilat UE/LE level 3 for 8 minutes  AROM/PROM for  hip performed and noted above  PPT 1x10 with 2s holds  Supine figure 4 2x30s each side  PT assisted for Lt side stretch as patient is highly painful   Physical Performance  TUG with results noted above   Manual:  Attempted STM to Lt glutes/piriformis, though discontinued secondary to high levels of pain    PATIENT  EDUCATION:  01/18/2024 Education details: HEP, POC Person educated: Patient Education method: Programmer, Multimedia, Demonstration, Verbal cues, and Handouts Education comprehension: verbalized understanding, returned demonstration, and verbal cues required  HOME EXERCISE PROGRAM: Access Code: [email or web address] URL: [email or web address] Date: 01/18/2024 Prepared by: Ozell Silvan  Exercises - Supine Lower Trunk Rotation  - 2-3 x daily - 7 x weekly - 1 sets - 3-5 reps - 15 hold - Standing Lumbar Extension  - 2-3 x daily - 7 x weekly - 1 sets - 5-10 reps - Supine Bridge  - 1-2 x daily - 7 x weekly - 1-2 sets - 10 reps - 2 hold - Clamshell  - 1-2 x daily - 7 x weekly - 2-3 sets - 10-15 reps  ASSESSMENT:  CLINICAL IMPRESSION: Updates of PSFS showed marked improvement. Strength testing also showed gains compared to evaluation presentation.  Check of static balance today showed room for improvements, introduced activity in clinic to address.   Continued skilled PT services indicated at this time.   OBJECTIVE IMPAIRMENTS: Abnormal gait, decreased activity tolerance, decreased balance, decreased coordination, decreased endurance, decreased mobility, difficulty walking, decreased ROM, decreased strength, increased fascial restrictions, impaired perceived functional ability, increased muscle spasms, impaired flexibility, improper body mechanics, postural dysfunction, and pain.   ACTIVITY LIMITATIONS: carrying, lifting, bending, sitting, standing, squatting, sleeping, stairs, transfers, bed mobility, bathing, dressing, reach over head, hygiene/grooming, and locomotion level  PARTICIPATION LIMITATIONS: meal prep, cleaning, laundry, interpersonal relationship, driving, shopping, and community activity  PERSONAL FACTORS: Medical history of anemia, anxiety, arthritis, depression, hyperlipidemia, HTN, DM2, vertigo, multiple pain areas, length of time since onset/severity of symptoms  are also affecting  patient's functional outcome.   REHAB POTENTIAL: Good  CLINICAL DECISION MAKING: Evolving/moderate complexity  EVALUATION COMPLEXITY: Moderate   GOALS: Goals reviewed with patient? Yes  SHORT TERM GOALS: (target date for Short term goals are 3 weeks 02/08/2024)  1. Patient will demonstrate independent use of home exercise program to maintain progress from in clinic treatments.  Goal status: partially met 02/07/2024  LONG TERM GOALS: (target dates for all long term goals are 10 weeks  03/28/2024 )   1. Patient will demonstrate/report pain at worst less than or equal to 2/10 to facilitate minimal limitation in daily activity secondary to pain symptoms.  Goal status: on going 02/16/2024   2. Patient will demonstrate independent use of home exercise program to facilitate ability to maintain/progress functional gains from skilled physical therapy services.  Goal status: on going 02/16/2024   3. Patient will demonstrate Patient specific functional scale avg > or = 8/10 to indicate reduced disability due to condition.   Goal status: on going 02/16/2024   4. Patient will demonstrate lumbar extension 100 % WFL s symptoms to facilitate upright standing, walking posture at PLOF s limitation.  Goal status: on going 02/16/2024   5.  Patient will demonstrate bilateral hip MMT 4/5 or greater for abduction, 5/5 for flexion, knee extension /flexion 5/5 bilateral for usual transfers, stairs and ambulation.   Goal status: on going 02/16/2024   6.  Patient will demonstrate ascending/descending stairs reciprocally s UE assist for community integration.   Goal status: on  going 02/16/2024   7.  Patient will demonstrate passive SLR bilaterally to > or = 70 deg to facilitate usual mobility at PLOF.  Goal Status: on going 02/16/2024  PLAN:  PT FREQUENCY: 1-2x/week  PT DURATION: 10 weeks  PLANNED INTERVENTIONS: Can include 02853- PT Re-evaluation, 97110-Therapeutic exercises, 97530- Therapeutic activity,  97112- Neuromuscular re-education, 97535- Self Care, 97140- Manual therapy, [phone]- Gait training, [phone]- Orthotic Fit/training, [phone]- Canalith repositioning, [id number]- Aquatic Therapy, [phone]- Electrical stimulation (unattended), [id number] Physical performance testing, 97016- Vasopneumatic device, [id number]- Ultrasound, [id number]- Traction (mechanical), [id number]- Ionotophoresis 4mg /ml Dexamethasone,  20560 - Needle insertion w/o injection 1 or 2 muscles, 20561 - Needle insertion w/o injection 3 or more muscles.    Patient/Family education, Balance training, Stair training, Taping, Dry Needling, Joint mobilization, Joint manipulation, Spinal manipulation, Spinal mobilization, Scar mobilization, Vestibular training, Visual/preceptual remediation/compensation, DME instructions, Cryotherapy, and Moist heat.  All performed as medically necessary.  All included unless contraindicated  PLAN FOR NEXT SESSION: Inclusion of balance challenges, LE strength.    Ozell Silvan, PT, DPT, OCS, ATC 02/16/2024  10:06 AM     Date of referral: 12/30/2023 Referring provider: Orie Milda CROME, MD Referring diagnosis? G89.29,M54.42 (ICD-10-CM) - Chronic bilateral low back pain with left-sided sciatica Treatment diagnosis? (if different than referring diagnosis) M54.59, M25.552, M62.81, R26.2, M79.671, F20.327  What was this (referring dx) caused by? Ongoing Issue  Lysle of Condition: Chronic (continuous duration > 3 months)   Laterality: Both bilateral back, Lt hip  Current Functional Measure Score: Patient Specific Functional Scale eval avg:   Objective measurements identify impairments when they are compared to normal values, the uninvolved extremity, and prior level of function.  [x]  Yes  []  No  Objective assessment of functional ability: Severe functional limitations   Briefly describe symptoms: Pt came to clinic c complaints of back, lt hip pain. Pt indicated insidious worsening of symptoms  Pt indicated difficulty  with bending, twisting, weight on Lt leg.  Reported pain across back and Lt hip/thigh.  Reported difficulty in walking, transfers due to symptoms.  Pt indicated no numbness/tingling.  Pt indicated symptoms have worsened over time.   Pt indicated some difficulty sleeping due to symptoms.   How did symptoms start: Insidious worsening over time.   Average pain intensity:  Last 24 hours: 9/10 at worst  Past week: 9/10 at worst  How often does the pt experience symptoms? Constantly  How much have the symptoms interfered with usual daily activities? Extremely  How has condition changed since care began at this facility? NA - initial visit  In general, how is the patients overall health? Good   BACK PAIN (STarT Back Screening Tool) Has pain spread down the leg(s) at some time in the last 2 weeks? yes Has there been pain in the shoulder or neck at some time in the last 2 weeks? no Has the pt only walked short distances because of back pain? yes Has patient dressed more slowly because of back pain in the past 2 weeks? yes Does patient think it's not safe for a person with this condition to be physically active? no Does patient have worrying thoughts a lot of the time? yes Does patient feel back pain is terrible and will never get any better? no Has patient stopped enjoying things they usually enjoy? yes Overall, how bothersome has back pain been in the last 2 weeks?                    Very Much  "

## 2024-02-22 NOTE — Therapy (Signed)
 " OUTPATIENT PHYSICAL THERAPY TREATMENT   Patient Name: Stacy Nguyen MRN: [id number] DOB:[birth year], 69 y.o., female Today's Date: 02/23/2024  END OF SESSION:  PT End of Session - 02/23/24 0927     Visit Number 6    Number of Visits 20    Date for Recertification  03/28/24    Authorization Type [facility] Medicare $20 copay    Authorization Time Period 20 visits 01/18/2024  through 03/28/2024    Authorization - Visit Number 6    Authorization - Number of Visits 20    Progress Note Due on Visit 10    PT Start Time 0929    PT Stop Time 1010    PT Time Calculation (min) 41 min    Activity Tolerance Patient tolerated treatment well    Behavior During Therapy [facility] for tasks assessed/performed               Past Medical History:  Diagnosis Date   Allergy    Anemia    as achild   Anxiety    Arthritis    all over   Cataract    bilateral - MD just watching   Depression    Hyperlipidemia    Hypertension    Type 2 diabetes mellitus (HCC)    Vertigo 08/03/2021   Past Surgical History:  Procedure Laterality Date   [place]OS[place]     greater than [place]rs [place]   SHOULDER SURGERY Left 1986   TUBAL LIGATION     UPPER GASTROINTESTINAL ENDOSCOPY     Patient Active Problem List   Diagnosis Date Noted   Snoring 08/19/2022   Depression, major, single episode, mild 04/28/2022   Osteopenia 04/27/2022   Sensorineural hearing loss (SNHL) of both ears 02/16/2022   Vertigo 08/03/2021   Prediabetes 02/19/2021   Hyperlipidemia 10/12/2018   HTN (hypertension) 08/25/2018    PCP: Orie Milda CROME, MD  REFERRING PROVIDER: Orie Milda CROME, MD  REFERRING DIAG: G89.29,M54.42 (ICD-10-CM) - Chronic bilateral low back pain with left-sided sciatica  Rationale for Evaluation and Treatment: Rehabilitation  THERAPY DIAG:  Other low back pain  Pain in left hip  Muscle weakness (generalized)  Difficulty in walking, not elsewhere classified  Pain in right foot  Pain in  left foot  ONSET DATE: Chronic worsening over last 3-4 months.   SUBJECTIVE:                                                                                                                                                                                           SUBJECTIVE STATEMENT: Patient reports that she has had an increase in  soreness in Lt hip secondary to increased activity (taking down Christmas decorations).    PERTINENT HISTORY:  Medical history of anemia, anxiety, arthritis, depression, hyperlipidemia, HTN, DM2, vertigo. Concurrent history of Rt foot pain.  Also indicate broken toe in Lt foot on Thanksgiving.      Pt came to clinic c complaints of back, lt hip pain. Pt indicated insidious worsening of symptoms  Pt indicated difficulty with bending, twisting, weight on Lt leg.  Reported pain across back and Lt hip/thigh.  Reported difficulty in walking, transfers due to symptoms.  Pt indicated no numbness/tingling.  Pt indicated symptoms have worsened over time.   Pt indicated some difficulty sleeping due to symptoms.    PAIN:  NPRS scale:: 5/10 soreness, not pain Pain location: back, Lt hip pain/thigh pain and into lateral lower leg at times.  Pain description: tight, cramp, constant Aggravating factors: WB on Lt leg, transfers, walking, bending, twisting.  Relieving factors: topical gel.   PRECAUTIONS: None  WEIGHT BEARING RESTRICTIONS: No  FALLS:  Has patient fallen in last 6 months? 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 Mild degenerative joint  changes of lumbar spine.  PATIENT SURVEYS:  Patient-Specific Activity Scoring Scheme  0 represents unable to perform. 10 represents able to perform at prior level. 0 1 2 3 4 5 6 7 8 9  10 (Date and Score)   Activity Eval  01/18/2024  02/16/2024  1. walking 2   6  2. Bending  2  6  3. Bed mobility 2 5  4. Twisting at waist 2 6  5. stairs 2 6  Score 2 avg 5.8 avg   Total score = sum of the activity scores/number of activities Minimum detectable change (90%CI) for average score = 2 points Minimum detectable change (90%CI) for single activity score = 3 points  SCREENING FOR RED FLAGS: 01/18/2024 Bowel or bladder incontinence: No Cauda equina syndrome: No  COGNITION: 01/18/2024 Overall cognitive status: WFL normal      SENSATION: 01/18/2024 [facility]  MUSCLE LENGTH: 01/18/2024 Passive SLR Lt 40 deg with back/hip pain Passive SLR Rt 60 deg with Rt leg tightness  POSTURE:  01/18/2024 Standing posture offtset with trunk lean to Rt due to walking shoe on Lt.  Provided shoe lift in Rt shoe to help offset. Analysis in standing without walking shoe showed unremarkable hip iliac crest alignment.  Reduced lumbar lordosis noted in standing.   PALPATION: 01/18/2024 Tenderness to light touch in Lt QL, Lt paraspinals, Lt glute max/med/min.  Trigger points noted.   LUMBAR ROM:  01/18/2024 Directional Preference Assessment: Centralization: not noted Peripheralization:  not noted  AROM Eval 01/18/2024 01/31/2024 02/07/2024  Flexion To mid thigh with pianful arc, gower's sign upon return  To mid shin, gowers sign up return.   Extension 50% WFL low back pain 75% WFL  Standing x 5 improved to 100%  100 % WFL  Right lateral flexion     Left lateral flexion     Right rotation     Left rotation      (Blank rows = not tested)  LOWER EXTREMITY ROM:      Right Eval 01/18/2024 Left Eval 01/18/2024 01/25/2024 Right 01/31/2024 Left 01/31/2024  Hip flexion   Rt AROM:  85deg Rt PROM: 105deg  Lt AROM: 74deg Lt PROM: 94deg    Hip extension       Hip abduction       Hip adduction       Hip internal rotation       Hip external rotation    54 PROM in 90 deg hip flexion 60 PROM in 90 deg hip flexion  Knee flexion       Knee extension       Ankle dorsiflexion       Ankle plantarflexion       Ankle inversion       Ankle eversion        (Blank rows = not tested)  LOWER EXTREMITY MMT:    MMT Right Eval 01/18/2024 Left Eval 01/18/2024 Right 02/16/2024 Left 02/16/2024  Hip flexion 5/5 4/5 5/5 5/5  Hip extension      Hip abduction 3+/5 2+/5    Hip adduction      Hip internal rotation      Hip external rotation      Knee flexion 4/5 4/5 5/5 5/5  Knee extension 5/5 4/5 5/5 4+/5  Ankle dorsiflexion      Ankle plantarflexion      Ankle inversion      Ankle eversion       (Blank rows = not tested)  LUMBAR SPECIAL TESTS:  01/18/2024 (+) Lt slump for increased tightness in posterior leg compared to Rt.  (-) crossed SLR bilaterally   FUNCTIONAL TESTS:  02/16/2024: Tandem stance with Lt in back, good for 30 seconds Tandem stance in Rt in back, unable without adjusting to modified tandem stance (foot foward  01/25/2024 TUG 22.56s   01/18/2024 No specific testing today.   GAIT: 01/18/2024 Independent ambulation with walking shoe on Lt foot due to toe fracture. Antalgic gait noted due to it.                                                                                                                                                                                                                    TODAY'S TREATMENT:  DATE:  02/23/2024 TherEx:  Nustep level 5 for 10 minutes bilat LE/UE Supine lower trunk rotations 2x30s each side  Supine figure 4 2x30s each side  Hooklying bridge with yellow TB around knees 2x10 with 2-3s holds   TherAct:   Sit<>stands from 18 arm chair 2x6   initially attempting to perform with UE use on arm rests and with femurs fully loaded; patient improved performance with verbal cues and was able to perform without UEs during second set   Neuro Re-Ed: Tandem stance 2x30s each leg back; 3x of lateral LOB with required UE use on // bars, but no required assist from PT more than SBA  Narrow stance on foam 2x1 minute with no UE use on // bars and no more than SBA required from PT  Lateral walks on foam with transition to/from firm and compliant surface 1x3 down and back with UE use on // bars    TODAY'S TREATMENT:                                                                                                         DATE:  02/16/2024 Therex: Nustep Lvl 6 5 mins UE/LE, lvl 5 5 mins UE/LE for 10 mins total Supine trunk rotation stretch 15 sec x 3 bilaterally   Supine hooklying bridge 2-3 sec hold 2 x 10    Neuro Re-ed (muscle activation/recruitment, balance, coordination) Modified tandem stance with foot forward but not directly in front 30 sec x 2 bilaterally with occasional HHA on bar, SBA Feet together stance on foam 1 min with SBA Supine hip extension into table 5 sec hold x 10 bilaterally   TherActivity  Cues for log rolling technique for supine to sit transfers.  Verbal cues given Sit to stand to sit with slow lowering focus from 18 inch chair 2 x 5     TODAY'S TREATMENT:                                                                                                         DATE:  02/07/2024 Therex: Nustep lvl 5 10 mins for ROM, endurance Continued review of HEP techniques and adjust distances accordingly based off symptoms. Supine hooklying trunk rotation 15 sec x 3 bilaterally  Supine hooklying bridge 2 sec hold 2 x 10  Supine figure 4 pull towards to tolerance 30 sec x 3 bilaterally  Supine hip extension into table 5 sec hold x 10, performed bilaterally     TODAY'S TREATMENT:  DATE:  01/31/2024 Therex: Nustep lvl 4 8 mins for ROM, endurance Supine bridge 2 x 10 Supine figure 4 pull towards to tolerance 30 sec x 3 bilaterally  Supine hooklying clam shell green band 2 x 10 bilateral Cues given for techniques of intervention.  Cues given to stretch to tightness but no pain increase levels.     TODAY'S TREATMENT:                                                                                                         DATE:  01/25/2024  TherEx:  Nustep with bilat UE/LE level 3 for 8 minutes  AROM/PROM for hip performed and noted above  PPT 1x10 with 2s holds  Supine figure 4 2x30s each side  PT assisted for Lt side stretch as patient is highly painful   Physical Performance  TUG with results noted above   Manual:  Attempted STM to Lt glutes/piriformis, though discontinued secondary to high levels of pain    PATIENT EDUCATION:  01/18/2024 Education details: HEP, POC Person educated: Patient Education method: Programmer, Multimedia, Demonstration, Verbal cues, and Handouts Education comprehension: verbalized understanding, returned demonstration, and verbal cues required  HOME EXERCISE PROGRAM: Access Code: [email or web address] URL: [email or web address] Date: 01/18/2024 Prepared by: Ozell Silvan  Exercises - Supine Lower Trunk Rotation  - 2-3 x daily - 7 x weekly - 1 sets - 3-5 reps - 15 hold - Standing Lumbar Extension  - 2-3 x daily - 7 x weekly - 1 sets - 5-10 reps - Supine Bridge  - 1-2 x daily - 7 x weekly - 1-2 sets - 10 reps - 2 hold - Clamshell  - 1-2 x daily - 7 x weekly - 2-3 sets - 10-15 reps  ASSESSMENT:  CLINICAL IMPRESSION: Patient arrived to session noting increase in soreness, but no pain. Patient tolerated all activities this date with no increase in pain and is showing subjective improvements with balance and stability challenges. Patient will  continue to benefit from skilled PT.   OBJECTIVE IMPAIRMENTS: Abnormal gait, decreased activity tolerance, decreased balance, decreased coordination, decreased endurance, decreased mobility, difficulty walking, decreased ROM, decreased strength, increased fascial restrictions, impaired perceived functional ability, increased muscle spasms, impaired flexibility, improper body mechanics, postural dysfunction, and pain.   ACTIVITY LIMITATIONS: carrying, lifting, bending, sitting, standing, squatting, sleeping, stairs, transfers, bed mobility, bathing, dressing, reach over head, hygiene/grooming, and locomotion level  PARTICIPATION LIMITATIONS: meal prep, cleaning, laundry, interpersonal relationship, driving, shopping, and community activity  PERSONAL FACTORS: Medical history of anemia, anxiety, arthritis, depression, hyperlipidemia, HTN, DM2, vertigo, multiple pain areas, length of time since onset/severity of symptoms  are also affecting patient's functional outcome.   REHAB POTENTIAL: Good  CLINICAL DECISION MAKING: Evolving/moderate complexity  EVALUATION COMPLEXITY: Moderate   GOALS: Goals reviewed with patient? Yes  SHORT TERM GOALS: (target date for Short term goals are 3 weeks 02/08/2024)  1. Patient will demonstrate independent use of home exercise program to maintain progress from in clinic treatments.  Goal status: partially met 02/07/2024  LONG TERM GOALS: (target dates for all long term  goals are 10 weeks  03/28/2024 )   1. Patient will demonstrate/report pain at worst less than or equal to 2/10 to facilitate minimal limitation in daily activity secondary to pain symptoms.  Goal status: on going 02/16/2024   2. Patient will demonstrate independent use of home exercise program to facilitate ability to maintain/progress functional gains from skilled physical therapy services.  Goal status: on going 02/16/2024   3. Patient will demonstrate Patient specific functional scale avg >  or = 8/10 to indicate reduced disability due to condition.   Goal status: on going 02/16/2024   4. Patient will demonstrate lumbar extension 100 % WFL s symptoms to facilitate upright standing, walking posture at PLOF s limitation.  Goal status: on going 02/16/2024   5.  Patient will demonstrate bilateral hip MMT 4/5 or greater for abduction, 5/5 for flexion, knee extension /flexion 5/5 bilateral for usual transfers, stairs and ambulation.   Goal status: on going 02/16/2024   6.  Patient will demonstrate ascending/descending stairs reciprocally s UE assist for community integration.   Goal status: on going 02/16/2024   7.  Patient will demonstrate passive SLR bilaterally to > or = 70 deg to facilitate usual mobility at PLOF.  Goal Status: on going 02/16/2024  PLAN:  PT FREQUENCY: 1-2x/week  PT DURATION: 10 weeks  PLANNED INTERVENTIONS: Can include 02853- PT Re-evaluation, 97110-Therapeutic exercises, 97530- Therapeutic activity, 97112- Neuromuscular re-education, 97535- Self Care, 97140- Manual therapy, [phone]- Gait training, [phone]- Orthotic Fit/training, [phone]- Canalith repositioning, [id number]- Aquatic Therapy, [phone]- Electrical stimulation (unattended), [id number] Physical performance testing, 97016- Vasopneumatic device, [id number]- Ultrasound, [id number]- Traction (mechanical), [id number]- Ionotophoresis 4mg /ml Dexamethasone,  20560 - Needle insertion w/o injection 1 or 2 muscles, 20561 - Needle insertion w/o injection 3 or more muscles.    Patient/Family education, Balance training, Stair training, Taping, Dry Needling, Joint mobilization, Joint manipulation, Spinal manipulation, Spinal mobilization, Scar mobilization, Vestibular training, Visual/preceptual remediation/compensation, DME instructions, Cryotherapy, and Moist heat.  All performed as medically necessary.  All included unless contraindicated  PLAN FOR NEXT SESSION: Inclusion of balance challenges, LE strength.    Susannah Daring, PT, DPT 02/23/24 10:15  AM      Date of referral: 12/30/2023 Referring provider: Orie Milda CROME, MD Referring diagnosis? G89.29,M54.42 (ICD-10-CM) - Chronic bilateral low back pain with left-sided sciatica Treatment diagnosis? (if different than referring diagnosis) M54.59, M25.552, M62.81, R26.2, M79.671, F20.327  What was this (referring dx) caused by? Ongoing Issue  Lysle of Condition: Chronic (continuous duration > 3 months)   Laterality: Both bilateral back, Lt hip  Current Functional Measure Score: Patient Specific Functional Scale eval avg:   Objective measurements identify impairments when they are compared to normal values, the uninvolved extremity, and prior level of function.  [x]  Yes  []  No  Objective assessment of functional ability: Severe functional limitations   Briefly describe symptoms: Pt came to clinic c complaints of back, lt hip pain. Pt indicated insidious worsening of symptoms  Pt indicated difficulty with bending, twisting, weight on Lt leg.  Reported pain across back and Lt hip/thigh.  Reported difficulty in walking, transfers due to symptoms.  Pt indicated no numbness/tingling.  Pt indicated symptoms have worsened over time.   Pt indicated some difficulty sleeping due to symptoms.   How did symptoms start: Insidious worsening over time.   Average pain intensity:  Last 24 hours: 9/10 at worst  Past week: 9/10 at worst  How often does the pt experience symptoms? Constantly  How much have the symptoms interfered with  usual daily activities? Extremely  How has condition changed since care began at this facility? NA - initial visit  In general, how is the patients overall health? Good   BACK PAIN (STarT Back Screening Tool) Has pain spread down the leg(s) at some time in the last 2 weeks? yes Has there been pain in the shoulder or neck at some time in the last 2 weeks? no Has the pt only walked short distances because of back pain? yes Has patient dressed more slowly  because of back pain in the past 2 weeks? yes Does patient think it's not safe for a person with this condition to be physically active? no Does patient have worrying thoughts a lot of the time? yes Does patient feel back pain is terrible and will never get any better? no Has patient stopped enjoying things they usually enjoy? yes Overall, how bothersome has back pain been in the last 2 weeks?                    Very Much  "

## 2024-02-23 ENCOUNTER — Ambulatory Visit

## 2024-02-23 DIAGNOSIS — M25552 Pain in left hip: Secondary | ICD-10-CM | POA: Diagnosis not present

## 2024-02-23 DIAGNOSIS — R262 Difficulty in walking, not elsewhere classified: Secondary | ICD-10-CM

## 2024-02-23 DIAGNOSIS — M79672 Pain in left foot: Secondary | ICD-10-CM | POA: Diagnosis not present

## 2024-02-23 DIAGNOSIS — M79671 Pain in right foot: Secondary | ICD-10-CM

## 2024-02-23 DIAGNOSIS — M5459 Other low back pain: Secondary | ICD-10-CM

## 2024-02-23 DIAGNOSIS — M6281 Muscle weakness (generalized): Secondary | ICD-10-CM | POA: Diagnosis not present

## 2024-03-01 ENCOUNTER — Ambulatory Visit

## 2024-03-01 DIAGNOSIS — M79672 Pain in left foot: Secondary | ICD-10-CM | POA: Diagnosis not present

## 2024-03-01 DIAGNOSIS — M5459 Other low back pain: Secondary | ICD-10-CM | POA: Diagnosis not present

## 2024-03-01 DIAGNOSIS — M79671 Pain in right foot: Secondary | ICD-10-CM | POA: Diagnosis not present

## 2024-03-01 DIAGNOSIS — R262 Difficulty in walking, not elsewhere classified: Secondary | ICD-10-CM

## 2024-03-01 DIAGNOSIS — M6281 Muscle weakness (generalized): Secondary | ICD-10-CM | POA: Diagnosis not present

## 2024-03-01 DIAGNOSIS — M25552 Pain in left hip: Secondary | ICD-10-CM | POA: Diagnosis not present

## 2024-03-01 NOTE — Therapy (Signed)
 " OUTPATIENT PHYSICAL THERAPY TREATMENT   Patient Name: Stacy Nguyen MRN: [id number] DOB:07/23/1955, 69 y.o., female Today's Date: 03/01/2024  END OF SESSION:  PT End of Session - 03/01/24 1102     Visit Number 7    Number of Visits 20    Date for Recertification  03/28/24    Authorization Type [employer] $20 copay    Authorization Time Period 20 visits 01/18/2024  through 03/28/2024    Authorization - Number of Visits 20    Progress Note Due on Visit 10    PT Start Time 1102    PT Stop Time 1141    PT Time Calculation (min) 39 min    Activity Tolerance Patient tolerated treatment well    Behavior During Therapy WFL for tasks assessed/performed            Past Medical History:  Diagnosis Date   Allergy    Anemia    as achild   Anxiety    Arthritis    all over   Cataract    bilateral - MD just watching   Depression    Hyperlipidemia    Hypertension    Type 2 diabetes mellitus (HCC)    Vertigo 08/03/2021   Past Surgical History:  Procedure Laterality Date   [place]OS[place]     greater than [place]rs [place]   SHOULDER SURGERY Left 1986   TUBAL LIGATION     UPPER GASTROINTESTINAL ENDOSCOPY     Patient Active Problem List   Diagnosis Date Noted   Snoring 08/19/2022   Depression, major, single episode, mild 04/28/2022   Osteopenia 04/27/2022   Sensorineural hearing loss (SNHL) of both ears 02/16/2022   Vertigo 08/03/2021   Prediabetes 02/19/2021   Hyperlipidemia 10/12/2018   HTN (hypertension) 08/25/2018    PCP: Orie Milda CROME, MD  REFERRING PROVIDER: Orie Milda CROME, MD  REFERRING DIAG: G89.29,M54.42 (ICD-10-CM) - Chronic bilateral low back pain with left-sided sciatica  Rationale for Evaluation and Treatment: Rehabilitation  THERAPY DIAG:  Other low back pain  Pain in left hip  Muscle weakness (generalized)  Difficulty in walking, not elsewhere classified  Pain in right foot  Pain in left foot  ONSET DATE: Chronic worsening  over last 3-4 months.   SUBJECTIVE:                                                                                                                                                                                           SUBJECTIVE STATEMENT: Patient reports that her back is hurting a lot more today compared to last time.    PERTINENT HISTORY:  Medical history of anemia, anxiety, arthritis, depression, hyperlipidemia, HTN, DM2, vertigo. Concurrent history of Rt foot pain.  Also indicate broken toe in Lt foot on Thanksgiving.      Pt came to clinic c complaints of back, lt hip pain. Pt indicated insidious worsening of symptoms  Pt indicated difficulty with bending, twisting, weight on Lt leg.  Reported pain across back and Lt hip/thigh.  Reported difficulty in walking, transfers due to symptoms.  Pt indicated no numbness/tingling.  Pt indicated symptoms have worsened over time.   Pt indicated some difficulty sleeping due to symptoms.    PAIN:  NPRS scale:: 7-8/10 pain in low back  Pain location: back, Lt hip pain/thigh pain and into lateral lower leg at times.  Pain description: tight, cramp, constant Aggravating factors: WB on Lt leg, transfers, walking, bending, twisting.  Relieving factors: topical gel.   PRECAUTIONS: None  WEIGHT BEARING RESTRICTIONS: No  FALLS:  Has patient fallen in last 6 months? 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 Mild degenerative joint changes of lumbar spine.  PATIENT SURVEYS:  Patient-Specific Activity Scoring Scheme  0  represents unable to perform. 10 represents able to perform at prior level. 0 1 2 3 4 5 6 7 8 9  10 (Date and Score)   Activity Eval  01/18/2024  02/16/2024  1. walking 2   6  2. Bending  2  6  3. Bed mobility 2 5  4. Twisting at waist 2 6  5. stairs 2 6  Score 2 avg 5.8 avg   Total score = sum of the activity scores/number of activities Minimum detectable change (90%CI) for average score = 2 points Minimum detectable change (90%CI) for single activity score = 3 points  SCREENING FOR RED FLAGS: 01/18/2024 Bowel or bladder incontinence: No Cauda equina syndrome: No  COGNITION: 01/18/2024 Overall cognitive status: WFL normal      SENSATION: 01/18/2024 [facility]  MUSCLE LENGTH: 01/18/2024 Passive SLR Lt 40 deg with back/hip pain Passive SLR Rt 60 deg with Rt leg tightness  POSTURE:  01/18/2024 Standing posture offtset with trunk lean to Rt due to walking shoe on Lt.  Provided shoe lift in Rt shoe to help offset. Analysis in standing without walking shoe showed unremarkable hip iliac crest alignment.  Reduced lumbar lordosis noted in standing.   PALPATION: 01/18/2024 Tenderness to light touch in Lt QL, Lt paraspinals, Lt glute max/med/min.  Trigger points noted.   LUMBAR ROM:  01/18/2024 Directional Preference Assessment: Centralization: not noted Peripheralization:  not noted  AROM Eval 01/18/2024 01/31/2024 02/07/2024  Flexion To mid thigh with pianful arc, gower's sign upon return  To mid shin, gowers sign up return.   Extension 50% WFL low back pain 75% WFL  Standing x 5 improved to 100%  100 % WFL  Right lateral flexion     Left lateral flexion     Right rotation     Left rotation      (Blank rows = not tested)  LOWER EXTREMITY ROM:      Right Eval 01/18/2024 Left Eval 01/18/2024 01/25/2024 Right  01/31/2024 Left 01/31/2024  Hip flexion   Rt AROM: 85deg Rt PROM: 105deg  Lt AROM: 74deg Lt PROM: 94deg    Hip extension       Hip abduction        Hip adduction       Hip internal rotation       Hip external rotation    54 PROM in 90 deg hip flexion 60 PROM in 90 deg hip flexion  Knee flexion       Knee extension       Ankle dorsiflexion       Ankle plantarflexion       Ankle inversion       Ankle eversion        (Blank rows = not tested)  LOWER EXTREMITY MMT:    MMT Right Eval 01/18/2024 Left Eval 01/18/2024 Right 02/16/2024 Left 02/16/2024  Hip flexion 5/5 4/5 5/5 5/5  Hip extension      Hip abduction 3+/5 2+/5    Hip adduction      Hip internal rotation      Hip external rotation      Knee flexion 4/5 4/5 5/5 5/5  Knee extension 5/5 4/5 5/5 4+/5  Ankle dorsiflexion      Ankle plantarflexion      Ankle inversion      Ankle eversion       (Blank rows = not tested)  LUMBAR SPECIAL TESTS:  01/18/2024 (+) Lt slump for increased tightness in posterior leg compared to Rt.  (-) crossed SLR bilaterally   FUNCTIONAL TESTS:  02/16/2024: Tandem stance with Lt in back, good for 30 seconds Tandem stance in Rt in back, unable without adjusting to modified tandem stance (foot foward  01/25/2024 TUG 22.56s   01/18/2024 No specific testing today.   GAIT: 01/18/2024 Independent ambulation with walking shoe on Lt foot due to toe fracture. Antalgic gait noted due to it.                                                                                                                                                                                                                    TODAY'S TREATMENT:  DATE:  03/01/2024 TherEx:  Nustep level 5 for 10 minutes bilat LE/UE  Lower trunk rotations 2x45s each side  Supine figure 4 2x45s each side   Manual:  IASTM with percussive device to lumbar musculature (began at thoracic musculature and slowly moved down) as well as bilat glutes/piriformis  Grade 1 mobs to lumbar spine for  pain relief    TODAY'S TREATMENT:                                                                                                         DATE:  02/23/2024 TherEx:  Nustep level 5 for 10 minutes bilat LE/UE Supine lower trunk rotations 2x30s each side  Supine figure 4 2x30s each side  Hooklying bridge with yellow TB around knees 2x10 with 2-3s holds   TherAct:  Sit<>stands from 18 arm chair 2x6   initially attempting to perform with UE use on arm rests and with femurs fully loaded; patient improved performance with verbal cues and was able to perform without UEs during second set   Neuro Re-Ed: Tandem stance 2x30s each leg back; 3x of lateral LOB with required UE use on // bars, but no required assist from PT more than SBA  Narrow stance on foam 2x1 minute with no UE use on // bars and no more than SBA required from PT  Lateral walks on foam with transition to/from firm and compliant surface 1x3 down and back with UE use on // bars    TODAY'S TREATMENT:                                                                                                         DATE:  02/16/2024 Therex: Nustep Lvl 6 5 mins UE/LE, lvl 5 5 mins UE/LE for 10 mins total Supine trunk rotation stretch 15 sec x 3 bilaterally   Supine hooklying bridge 2-3 sec hold 2 x 10    Neuro Re-ed (muscle activation/recruitment, balance, coordination) Modified tandem stance with foot forward but not directly in front 30 sec x 2 bilaterally with occasional HHA on bar, SBA Feet together stance on foam 1 min with SBA Supine hip extension into table 5 sec hold x 10 bilaterally   TherActivity  Cues for log rolling technique for supine to sit transfers.  Verbal cues given Sit to stand to sit with slow lowering focus from 18 inch chair 2 x 5     TODAY'S TREATMENT:  DATE:  02/07/2024 Therex: Nustep lvl 5 10 mins for ROM,  endurance Continued review of HEP techniques and adjust distances accordingly based off symptoms. Supine hooklying trunk rotation 15 sec x 3 bilaterally  Supine hooklying bridge 2 sec hold 2 x 10  Supine figure 4 pull towards to tolerance 30 sec x 3 bilaterally  Supine hip extension into table 5 sec hold x 10, performed bilaterally     TODAY'S TREATMENT:                                                                                                         DATE:  01/31/2024 Therex: Nustep lvl 4 8 mins for ROM, endurance Supine bridge 2 x 10 Supine figure 4 pull towards to tolerance 30 sec x 3 bilaterally  Supine hooklying clam shell green band 2 x 10 bilateral Cues given for techniques of intervention.  Cues given to stretch to tightness but no pain increase levels.      PATIENT EDUCATION:  01/18/2024 Education details: HEP, POC Person educated: Patient Education method: Programmer, Multimedia, Demonstration, Verbal cues, and Handouts Education comprehension: verbalized understanding, returned demonstration, and verbal cues required  HOME EXERCISE PROGRAM: Access Code: [email or web address] URL: [email or web address] Date: 01/18/2024 Prepared by: Ozell Silvan  Exercises - Supine Lower Trunk Rotation  - 2-3 x daily - 7 x weekly - 1 sets - 3-5 reps - 15 hold - Standing Lumbar Extension  - 2-3 x daily - 7 x weekly - 1 sets - 5-10 reps - Supine Bridge  - 1-2 x daily - 7 x weekly - 1-2 sets - 10 reps - 2 hold - Clamshell  - 1-2 x daily - 7 x weekly - 2-3 sets - 10-15 reps  ASSESSMENT:  CLINICAL IMPRESSION: Patient arrived to session noting increased low back pain this date. Patient was highly limited throughout session secondary to this high level of pain. Patient will continue to benefit from skilled PT.   OBJECTIVE IMPAIRMENTS: Abnormal gait, decreased activity tolerance, decreased balance, decreased coordination, decreased endurance, decreased mobility, difficulty walking, decreased  ROM, decreased strength, increased fascial restrictions, impaired perceived functional ability, increased muscle spasms, impaired flexibility, improper body mechanics, postural dysfunction, and pain.   ACTIVITY LIMITATIONS: carrying, lifting, bending, sitting, standing, squatting, sleeping, stairs, transfers, bed mobility, bathing, dressing, reach over head, hygiene/grooming, and locomotion level  PARTICIPATION LIMITATIONS: meal prep, cleaning, laundry, interpersonal relationship, driving, shopping, and community activity  PERSONAL FACTORS: Medical history of anemia, anxiety, arthritis, depression, hyperlipidemia, HTN, DM2, vertigo, multiple pain areas, length of time since onset/severity of symptoms  are also affecting patient's functional outcome.   REHAB POTENTIAL: Good  CLINICAL DECISION MAKING: Evolving/moderate complexity  EVALUATION COMPLEXITY: Moderate   GOALS: Goals reviewed with patient? Yes  SHORT TERM GOALS: (target date for Short term goals are 3 weeks 02/08/2024)  1. Patient will demonstrate independent use of home exercise program to maintain progress from in clinic treatments.  Goal status: partially met 02/07/2024  LONG TERM GOALS: (target dates for all long term goals are 10 weeks  03/28/2024 )   1. Patient  will demonstrate/report pain at worst less than or equal to 2/10 to facilitate minimal limitation in daily activity secondary to pain symptoms.  Goal status: on going 02/16/2024   2. Patient will demonstrate independent use of home exercise program to facilitate ability to maintain/progress functional gains from skilled physical therapy services.  Goal status: on going 02/16/2024   3. Patient will demonstrate Patient specific functional scale avg > or = 8/10 to indicate reduced disability due to condition.   Goal status: on going 02/16/2024   4. Patient will demonstrate lumbar extension 100 % WFL s symptoms to facilitate upright standing, walking posture at PLOF s  limitation.  Goal status: on going 02/16/2024   5.  Patient will demonstrate bilateral hip MMT 4/5 or greater for abduction, 5/5 for flexion, knee extension /flexion 5/5 bilateral for usual transfers, stairs and ambulation.   Goal status: on going 02/16/2024   6.  Patient will demonstrate ascending/descending stairs reciprocally s UE assist for community integration.   Goal status: on going 02/16/2024   7.  Patient will demonstrate passive SLR bilaterally to > or = 70 deg to facilitate usual mobility at PLOF.  Goal Status: on going 02/16/2024  PLAN:  PT FREQUENCY: 1-2x/week  PT DURATION: 10 weeks  PLANNED INTERVENTIONS: Can include 02853- PT Re-evaluation, 97110-Therapeutic exercises, 97530- Therapeutic activity, 97112- Neuromuscular re-education, 97535- Self Care, 97140- Manual therapy, [phone]- Gait training, [id number]- Orthotic Fit/training, [phone]- Canalith repositioning, [id number]- Aquatic Therapy, [phone]- Electrical stimulation (unattended), [id number] Physical performance testing, 97016- Vasopneumatic device, [id number]- Ultrasound, [id number]- Traction (mechanical), [id number]- Ionotophoresis 4mg /ml Dexamethasone,  20560 - Needle insertion w/o injection 1 or 2 muscles, 20561 - Needle insertion w/o injection 3 or more muscles.    Patient/Family education, Balance training, Stair training, Taping, Dry Needling, Joint mobilization, Joint manipulation, Spinal manipulation, Spinal mobilization, Scar mobilization, Vestibular training, Visual/preceptual remediation/compensation, DME instructions, Cryotherapy, and Moist heat.  All performed as medically necessary.  All included unless contraindicated  PLAN FOR NEXT SESSION:  Inclusion of balance challenges, LE strength. Reassess low back pain   Susannah Daring, PT, DPT 03/01/24 11:45 AM      Date of referral: 12/30/2023 Referring provider: Orie Milda CROME, MD Referring diagnosis? G89.29,M54.42 (ICD-10-CM) - Chronic bilateral low back pain with left-sided  sciatica Treatment diagnosis? (if different than referring diagnosis) M54.59, M25.552, M62.81, R26.2, M79.671, F20.327  What was this (referring dx) caused by? Ongoing Issue  Lysle of Condition: Chronic (continuous duration > 3 months)   Laterality: Both bilateral back, Lt hip  Current Functional Measure Score: Patient Specific Functional Scale eval avg:   Objective measurements identify impairments when they are compared to normal values, the uninvolved extremity, and prior level of function.  [x]  Yes  []  No  Objective assessment of functional ability: Severe functional limitations   Briefly describe symptoms: Pt came to clinic c complaints of back, lt hip pain. Pt indicated insidious worsening of symptoms  Pt indicated difficulty with bending, twisting, weight on Lt leg.  Reported pain across back and Lt hip/thigh.  Reported difficulty in walking, transfers due to symptoms.  Pt indicated no numbness/tingling.  Pt indicated symptoms have worsened over time.   Pt indicated some difficulty sleeping due to symptoms.   How did symptoms start: Insidious worsening over time.   Average pain intensity:  Last 24 hours: 9/10 at worst  Past week: 9/10 at worst  How often does the pt experience symptoms? Constantly  How much have the symptoms interfered with usual daily activities? Extremely  How has  condition changed since care began at this facility? NA - initial visit  In general, how is the patients overall health? Good   BACK PAIN (STarT Back Screening Tool) Has pain spread down the leg(s) at some time in the last 2 weeks? yes Has there been pain in the shoulder or neck at some time in the last 2 weeks? no Has the pt only walked short distances because of back pain? yes Has patient dressed more slowly because of back pain in the past 2 weeks? yes Does patient think it's not safe for a person with this condition to be physically active? no Does patient have worrying thoughts a lot  of the time? yes Does patient feel back pain is terrible and will never get any better? no Has patient stopped enjoying things they usually enjoy? yes Overall, how bothersome has back pain been in the last 2 weeks?                    Very Much  "

## 2024-03-08 ENCOUNTER — Ambulatory Visit: Admitting: Physical Therapy

## 2024-03-08 ENCOUNTER — Encounter: Payer: Self-pay | Admitting: Physical Therapy

## 2024-03-08 DIAGNOSIS — M5459 Other low back pain: Secondary | ICD-10-CM

## 2024-03-08 DIAGNOSIS — M6281 Muscle weakness (generalized): Secondary | ICD-10-CM

## 2024-03-08 DIAGNOSIS — M25552 Pain in left hip: Secondary | ICD-10-CM

## 2024-03-08 DIAGNOSIS — R262 Difficulty in walking, not elsewhere classified: Secondary | ICD-10-CM | POA: Diagnosis not present

## 2024-03-08 NOTE — Therapy (Signed)
 " OUTPATIENT PHYSICAL THERAPY TREATMENT   Patient Name: Stacy Nguyen MRN: [id number] DOB:[birth year], 69 y.o., female Today's Date: 03/08/2024  END OF SESSION:  PT End of Session - 03/08/24 0936     Visit Number 8    Number of Visits 20    Date for Recertification  03/28/24    Authorization Type [employer] $20 copay    Authorization Time Period 20 visits 01/18/2024  through 03/28/2024    Authorization - Visit Number 8    Authorization - Number of Visits 20    Progress Note Due on Visit 10    PT Start Time 0930    PT Stop Time 1010    PT Time Calculation (min) 40 min    Activity Tolerance Patient tolerated treatment well    Behavior During Therapy [facility] for tasks assessed/performed             Past Medical History:  Diagnosis Date   Allergy    Anemia    as achild   Anxiety    Arthritis    all over   Cataract    bilateral - MD just watching   Depression    Hyperlipidemia    Hypertension    Type 2 diabetes mellitus (HCC)    Vertigo 08/03/2021   Past Surgical History:  Procedure Laterality Date   [place]OS[place]     greater than [place]rs [place]   SHOULDER SURGERY Left 1986   TUBAL LIGATION     UPPER GASTROINTESTINAL ENDOSCOPY     Patient Active Problem List   Diagnosis Date Noted   Snoring 08/19/2022   Depression, major, single episode, mild 04/28/2022   Osteopenia 04/27/2022   Sensorineural hearing loss (SNHL) of both ears 02/16/2022   Vertigo 08/03/2021   Prediabetes 02/19/2021   Hyperlipidemia 10/12/2018   HTN (hypertension) 08/25/2018    PCP: Orie Milda CROME, MD  REFERRING PROVIDER: Orie Milda CROME, MD  REFERRING DIAG: G89.29,M54.42 (ICD-10-CM) - Chronic bilateral low back pain with left-sided sciatica  Rationale for Evaluation and Treatment: Rehabilitation  THERAPY DIAG:  Other low back pain  Pain in left hip  Muscle weakness (generalized)  Difficulty in walking, not elsewhere classified  ONSET DATE: Chronic worsening over  last 3-4 months.   SUBJECTIVE:                                                                                                                                                                                           SUBJECTIVE STATEMENT: Lt hip is having sharp pains; worse than before and not sure if it's the cold or what is causing it.  PERTINENT HISTORY:  Medical history of anemia, anxiety, arthritis, depression, hyperlipidemia, HTN, DM2, vertigo. Concurrent history of Rt foot pain.  Also indicate broken toe in Lt foot on Thanksgiving.      Pt came to clinic c complaints of back, lt hip pain. Pt indicated insidious worsening of symptoms  Pt indicated difficulty with bending, twisting, weight on Lt leg.  Reported pain across back and Lt hip/thigh.  Reported difficulty in walking, transfers due to symptoms.  Pt indicated no numbness/tingling.  Pt indicated symptoms have worsened over time.   Pt indicated some difficulty sleeping due to symptoms.    PAIN:  NPRS scale:: 7-8/10 pain in Lt hip Pain location: back, Lt hip pain/thigh pain and into lateral lower leg at times.  Pain description: tight, cramp, constant Aggravating factors: WB on Lt leg, transfers, walking, bending, twisting.  Relieving factors: topical gel.   PRECAUTIONS: None  WEIGHT BEARING RESTRICTIONS: No  FALLS:  Has patient fallen in last 6 months? 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 Mild degenerative joint changes of lumbar spine.  PATIENT SURVEYS:  Patient-Specific Activity Scoring  Scheme  0 represents unable to perform. 10 represents able to perform at prior level. 0 1 2 3 4 5 6 7 8 9  10 (Date and Score)   Activity Eval  01/18/2024   02/16/2024  03/08/24  1. Walking 2   6 7   2. Bending  2  6 7   3. Bed mobility 2 5 8   4. Twisting at waist 2 6 6   5. Stairs 2 6 7   Score 2 avg 5.8 avg 7 avg   Total score = sum of the activity scores/number of activities Minimum detectable change (90%CI) for average score = 2 points Minimum detectable change (90%CI) for single activity score = 3 points  SCREENING FOR RED FLAGS: 01/18/2024 Bowel or bladder incontinence: No Cauda equina syndrome: No  COGNITION: 01/18/2024 Overall cognitive status: WFL normal      SENSATION: 01/18/2024 [facility]  MUSCLE LENGTH: 01/18/2024 Passive SLR Lt 40 deg with back/hip pain Passive SLR Rt 60 deg with Rt leg tightness  POSTURE:  01/18/2024 Standing posture offtset with trunk lean to Rt due to walking shoe on Lt.  Provided shoe lift in Rt shoe to help offset. Analysis in standing without walking shoe showed unremarkable hip iliac crest alignment.  Reduced lumbar lordosis noted in standing.   PALPATION: 01/18/2024 Tenderness to light touch in Lt QL, Lt paraspinals, Lt glute max/med/min.  Trigger points noted.   LUMBAR ROM:  01/18/2024 Directional Preference Assessment: Centralization: not noted Peripheralization:  not noted  AROM Eval 01/18/2024 01/31/2024 02/07/2024  Flexion To mid thigh with pianful arc, gower's sign upon return  To mid shin, gowers sign up return.   Extension 50% WFL low back pain 75% WFL  Standing x 5 improved to 100%  100 % WFL  Right lateral flexion     Left lateral flexion     Right rotation     Left rotation      (Blank rows = not tested)  LOWER EXTREMITY ROM:  Right Eval 01/18/2024 Left Eval 01/18/2024 01/25/2024 Right 01/31/2024 Left 01/31/2024  Hip flexion   Rt AROM: 85deg Rt PROM: 105deg  Lt AROM: 74deg Lt PROM: 94deg     Hip extension       Hip abduction       Hip adduction       Hip internal rotation       Hip external rotation    54 PROM in 90 deg hip flexion 60 PROM in 90 deg hip flexion   (Blank rows = not tested)  LOWER EXTREMITY MMT:    MMT Right Eval 01/18/2024 Left Eval 01/18/2024 Right 02/16/2024 Left 02/16/2024  Hip flexion 5/5 4/5 5/5 5/5  Hip extension      Hip abduction 3+/5 2+/5    Hip adduction      Hip internal rotation      Hip external rotation      Knee flexion 4/5 4/5 5/5 5/5  Knee extension 5/5 4/5 5/5 4+/5   (Blank rows = not tested)  LUMBAR SPECIAL TESTS:  01/18/2024 (+) Lt slump for increased tightness in posterior leg compared to Rt.  (-) crossed SLR bilaterally   FUNCTIONAL TESTS:  02/16/2024: Tandem stance with Lt in back, good for 30 seconds Tandem stance in Rt in back, unable without adjusting to modified tandem stance (foot foward  01/25/2024 TUG 22.56s   01/18/2024 No specific testing today.   GAIT: 01/18/2024 Independent ambulation with walking shoe on Lt foot due to toe fracture. Antalgic gait noted due to it.                                                                                                                                                                                                                    TODAY'S TREATMENT 03/08/24 TherAct NuStep L5 x 10 min; UEs/LEs  TherEx Single knee to chest 3x30 sec bil Lower trunk rotation 5x10 sec hold bil Bridges 10 x 5 sec hold  Self-Care Discussed current progress and POC; reviewed PSFS score with pt.  Agree for pt to reach out to MD to discuss additional treatment options as pain continues to be elevated   03/01/2024 TherEx:  Nustep level 5 for 10 minutes bilat LE/UE  Lower trunk rotations 2x45s each side  Supine figure 4 2x45s each side   Manual:  IASTM with percussive device to lumbar musculature (began at thoracic musculature and slowly moved down) as well as bilat  glutes/piriformis  Grade 1 mobs to lumbar spine for pain relief  02/23/2024 TherEx:  Nustep level 5 for 10 minutes bilat LE/UE Supine lower trunk rotations 2x30s each side  Supine figure 4 2x30s each side  Hooklying bridge with yellow TB around knees 2x10 with 2-3s holds   TherAct:  Sit<>stands from 18 arm chair 2x6   initially attempting to perform with UE use on arm rests and with femurs fully loaded; patient improved performance with verbal cues and was able to perform without UEs during second set   Neuro Re-Ed: Tandem stance 2x30s each leg back; 3x of lateral LOB with required UE use on // bars, but no required assist from PT more than SBA  Narrow stance on foam 2x1 minute with no UE use on // bars and no more than SBA required from PT  Lateral walks on foam with transition to/from firm and compliant surface 1x3 down and back with UE use on // bars     02/16/2024 Therex: Nustep Lvl 6 5 mins UE/LE, lvl 5 5 mins UE/LE for 10 mins total Supine trunk rotation stretch 15 sec x 3 bilaterally   Supine hooklying bridge 2-3 sec hold 2 x 10    Neuro Re-ed (muscle activation/recruitment, balance, coordination) Modified tandem stance with foot forward but not directly in front 30 sec x 2 bilaterally with occasional HHA on bar, SBA Feet together stance on foam 1 min with SBA Supine hip extension into table 5 sec hold x 10 bilaterally   TherActivity  Cues for log rolling technique for supine to sit transfers.  Verbal cues given Sit to stand to sit with slow lowering focus from 18 inch chair 2 x 5    PATIENT EDUCATION:  01/18/2024 Education details: HEP, POC Person educated: Patient Education method: Explanation, Demonstration, Verbal cues, and Handouts Education comprehension: verbalized understanding, returned demonstration, and verbal cues required  HOME EXERCISE PROGRAM: Access Code: [email or web address] URL: [email or web address] Date: 01/18/2024 Prepared by: Ozell Silvan  Exercises - Supine Lower Trunk Rotation  - 2-3 x daily - 7 x weekly - 1 sets - 3-5 reps - 15 hold - Standing Lumbar Extension  - 2-3 x daily - 7 x weekly - 1 sets - 5-10 reps - Supine Bridge  - 1-2 x daily - 7 x weekly - 1-2 sets - 10 reps - 2 hold - Clamshell  - 1-2 x daily - 7 x weekly - 2-3 sets - 10-15 reps  ASSESSMENT:  CLINICAL IMPRESSION: Pt has made some functional progress at this time but pain continues to be elevated.  At this time pt in agreement to hold PT and reach out to referring provider to discuss next additional recommendations.  Will hold PT.    OBJECTIVE IMPAIRMENTS: Abnormal gait, decreased activity tolerance, decreased balance, decreased coordination, decreased endurance, decreased mobility, difficulty walking, decreased ROM, decreased strength, increased fascial restrictions, impaired perceived functional ability, increased muscle spasms, impaired flexibility, improper body mechanics, postural dysfunction, and pain.   ACTIVITY LIMITATIONS: carrying, lifting, bending, sitting, standing, squatting, sleeping, stairs, transfers, bed mobility, bathing, dressing, reach over head, hygiene/grooming, and locomotion level  PARTICIPATION LIMITATIONS: meal prep, cleaning, laundry, interpersonal relationship, driving, shopping, and community activity  PERSONAL FACTORS: Medical history of anemia, anxiety, arthritis, depression, hyperlipidemia, HTN, DM2, vertigo, multiple pain areas, length of time since onset/severity of symptoms  are also affecting patient's functional outcome.   REHAB POTENTIAL: Good  CLINICAL DECISION MAKING: Evolving/moderate complexity  EVALUATION COMPLEXITY: Moderate   GOALS: Goals reviewed with patient? Yes  SHORT TERM GOALS: (target date for Short term goals  are 3 weeks 02/08/2024)  1. Patient will demonstrate independent use of home exercise program to maintain progress from in clinic treatments.  Goal status: partially met  02/07/2024  LONG TERM GOALS: (target dates for all long term goals are 10 weeks  03/28/2024 )   1. Patient will demonstrate/report pain at worst less than or equal to 2/10 to facilitate minimal limitation in daily activity secondary to pain symptoms.  Goal status: on going 03/08/24   2. Patient will demonstrate independent use of home exercise program to facilitate ability to maintain/progress functional gains from skilled physical therapy services.  Goal status: on going 03/08/24   3. Patient will demonstrate Patient specific functional scale avg > or = 8/10 to indicate reduced disability due to condition.   Goal status: ongoing 03/08/24   4. Patient will demonstrate lumbar extension 100 % WFL s symptoms to facilitate upright standing, walking posture at PLOF s limitation.  Goal status: MET 02/16/2024   5.  Patient will demonstrate bilateral hip MMT 4/5 or greater for abduction, 5/5 for flexion, knee extension /flexion 5/5 bilateral for usual transfers, stairs and ambulation.    Goal status: on going 02/16/2024   6.  Patient will demonstrate ascending/descending stairs reciprocally s UE assist for community integration.   Goal status: on going 02/16/2024   7.  Patient will demonstrate passive SLR bilaterally to > or = 70 deg to facilitate usual mobility at PLOF.  Goal Status: on going 02/16/2024  PLAN:  PT FREQUENCY: 1-2x/week  PT DURATION: 10 weeks  PLANNED INTERVENTIONS: Can include 02853- PT Re-evaluation, 97110-Therapeutic exercises, 97530- Therapeutic activity, 97112- Neuromuscular re-education, 97535- Self Care, 97140- Manual therapy, [phone]- Gait training, [phone]- Orthotic Fit/training, [phone]- Canalith repositioning, [id number]- Aquatic Therapy, [phone]- Electrical stimulation (unattended), [id number] Physical performance testing, 97016- Vasopneumatic device, [id number]- Ultrasound, [id number]- Traction (mechanical), [id number]- Ionotophoresis 4mg /ml Dexamethasone,  20560 - Needle insertion w/o injection 1 or 2  muscles, 20561 - Needle insertion w/o injection 3 or more muscles.    Patient/Family education, Balance training, Stair training, Taping, Dry Needling, Joint mobilization, Joint manipulation, Spinal manipulation, Spinal mobilization, Scar mobilization, Vestibular training, Visual/preceptual remediation/compensation, DME instructions, Cryotherapy, and Moist heat.  All performed as medically necessary.  All included unless contraindicated  PLAN FOR NEXT SESSION:  hold PT x 30 days; pt to return to MD   Corean JULIANNA Ku, PT, DPT 03/08/24 10:13 AM      Date of referral: 12/30/2023 Referring provider: Orie Milda CROME, MD Referring diagnosis? G89.29,M54.42 (ICD-10-CM) - Chronic bilateral low back pain with left-sided sciatica Treatment diagnosis? (if different than referring diagnosis) M54.59, M25.552, M62.81, R26.2, M79.671, F20.327  What was this (referring dx) caused by? Ongoing Issue  Lysle of Condition: Chronic (continuous duration > 3 months)   Laterality: Both bilateral back, Lt hip  Current Functional Measure Score: Patient Specific Functional Scale eval avg:   Objective measurements identify impairments when they are compared to normal values, the uninvolved extremity, and prior level of function.  [x]  Yes  []  No  Objective assessment of functional ability: Severe functional limitations   Briefly describe symptoms: Pt came to clinic c complaints of back, lt hip pain. Pt indicated insidious worsening of symptoms  Pt indicated difficulty with bending, twisting, weight on Lt leg.  Reported pain across back and Lt hip/thigh.  Reported difficulty in walking, transfers due to symptoms.  Pt indicated no numbness/tingling.  Pt indicated symptoms have worsened over time.   Pt indicated some difficulty sleeping due to symptoms.   How  did symptoms start: Insidious worsening over time.   Average pain intensity:  Last 24 hours: 9/10 at worst  Past week: 9/10 at worst  How often  does the pt experience symptoms? Constantly  How much have the symptoms interfered with usual daily activities? Extremely  How has condition changed since care began at this facility? NA - initial visit  In general, how is the patients overall health? Good   BACK PAIN (STarT Back Screening Tool) Has pain spread down the leg(s) at some time in the last 2 weeks? yes Has there been pain in the shoulder or neck at some time in the last 2 weeks? no Has the pt only walked short distances because of back pain? yes Has patient dressed more slowly because of back pain in the past 2 weeks? yes Does patient think it's not safe for a person with this condition to be physically active? no Does patient have worrying thoughts a lot of the time? yes Does patient feel back pain is terrible and will never get any better? no Has patient stopped enjoying things they usually enjoy? yes Overall, how bothersome has back pain been in the last 2 weeks?                    Very Much  "

## 2024-03-15 ENCOUNTER — Ambulatory Visit

## 2024-03-15 ENCOUNTER — Ambulatory Visit: Payer: Self-pay

## 2024-03-15 VITALS — BP 137/79 | HR 74 | Ht 60.0 in | Wt 143.2 lb

## 2024-03-15 DIAGNOSIS — R7303 Prediabetes: Secondary | ICD-10-CM

## 2024-03-15 DIAGNOSIS — R8290 Unspecified abnormal findings in urine: Secondary | ICD-10-CM

## 2024-03-15 DIAGNOSIS — I10 Essential (primary) hypertension: Secondary | ICD-10-CM

## 2024-03-15 DIAGNOSIS — R1032 Left lower quadrant pain: Secondary | ICD-10-CM

## 2024-03-15 DIAGNOSIS — M25552 Pain in left hip: Secondary | ICD-10-CM

## 2024-03-15 LAB — POCT GLYCOSYLATED HEMOGLOBIN (HGB A1C): HbA1c, POC (prediabetic range): 6.1 % (ref 5.7–6.4)

## 2024-03-15 LAB — POCT URINALYSIS DIP (MANUAL ENTRY)
Bilirubin, UA: NEGATIVE
Glucose, UA: NEGATIVE mg/dL
Ketones, POC UA: NEGATIVE mg/dL
Leukocytes, UA: NEGATIVE
Nitrite, UA: NEGATIVE
Protein Ur, POC: NEGATIVE mg/dL
Spec Grav, UA: 1.02
Urobilinogen, UA: 0.2 U/dL
pH, UA: 7

## 2024-03-15 NOTE — Progress Notes (Signed)
" ° ° °  SUBJECTIVE:   CHIEF COMPLAINT / HPI:   HTN BP Readings from Last 4 Encounters:  03/15/24 137/79  01/24/24 118/70  12/30/23 131/77  11/08/23 (!) 148/79   L Hip Pain - hx of chronic low back and L hip pain. Has been doing PT but continues to have pain. Feels she has sharp pain with going up stairs.  - Difficulty to bend over, get out of low chair, walk - Has active job - Has taken zanaflex  with mild improvement in spasms but not pain. She takes 400-800mg  ibuprofen  daily.  - denies falls, weakness, saddle anesthesia, weight loss  Urine Odor - one month of strong urine smell - Denies dark urine, dysuria, hematuria, fever, NVD - Drinks appx 16 oz water daily  PreDM Lab Results  Component Value Date   HGBA1C 6.1 11/08/2023    PERTINENT  PMH / PSH: PreDM, HTN, Depression, back pain  OBJECTIVE:   BP 137/79   Pulse 74   Ht 5' (1.524 m)   Wt 143 lb 3.2 oz (65 kg)   LMP  (LMP Unknown)   SpO2 100%   BMI 27.97 kg/m   Physical Exam General: Alert, conversant, cooperative. No acute distress.  HEENT: PERRL. EOMI. MMM.  Cardiovascular: RRR Respiratory: Lungs CTAB. Normal work of breathing. Musculoskeletal: No gross deformities. TTP L SI joint. Pain with FABER. Neg SLR. 5/5 LE strength. Normal sensation.  Skin: Warm. Dry. No rashes. No icterus.  Neurologic: No focal deficits. Moving all extremities. Psychiatric: Cooperative. Appropriate mood. Appropriate affect.   ASSESSMENT/PLAN:   Assessment & Plan Prediabetes - repeat A1c today Left hip pain Continue PT and NSAID. Suspect OA based on symptoms and lumbar xray. Will get hip xray per her request and refer to sports med Left groin pain As above, suspect groin pain referred from hip pain. She does have some complaint of foul smelling urine so ordered UA to evaluate for UTI, renal stone.  Primary hypertension At goal today, continue current medications Foul smelling urine UA ordered today.      Milda LITTIE Deed,  MD [facility] "

## 2024-03-15 NOTE — Patient Instructions (Signed)
 It was good to see you today.   Please bring ALL of your medications with you to every visit.    Today we talked about: Hip pain- xray ordered, referral to sports medicine.  Urine Smell- Urinalysis today Pre Diabetes- A1C checked today HTN- BP good today    Thank you for choosing [facility]. Please refer to your mychart for specifics regarding today's visit or future appointments.

## 2024-03-15 NOTE — Assessment & Plan Note (Signed)
 At goal today, continue current medications

## 2024-03-15 NOTE — Assessment & Plan Note (Signed)
-   repeat A1c today

## 2024-03-16 ENCOUNTER — Ambulatory Visit

## 2024-03-16 ENCOUNTER — Other Ambulatory Visit: Payer: Self-pay

## 2024-03-16 ENCOUNTER — Telehealth: Payer: Self-pay

## 2024-03-16 VITALS — BP 152/76 | Ht 60.0 in | Wt 140.0 lb

## 2024-03-16 DIAGNOSIS — G8929 Other chronic pain: Secondary | ICD-10-CM

## 2024-03-16 DIAGNOSIS — M25552 Pain in left hip: Secondary | ICD-10-CM

## 2024-03-16 DIAGNOSIS — M25559 Pain in unspecified hip: Secondary | ICD-10-CM

## 2024-03-16 DIAGNOSIS — M67952 Unspecified disorder of synovium and tendon, left thigh: Secondary | ICD-10-CM

## 2024-03-16 MED ORDER — GABAPENTIN 300 MG PO CAPS: 300.0000 mg | ORAL_CAPSULE | Freq: Every day | ORAL | 2 refills | Status: AC

## 2024-03-16 MED ORDER — METHYLPREDNISOLONE ACETATE 80 MG/ML IJ SUSP
80.0000 mg | Freq: Once | INTRAMUSCULAR | Status: AC
  Administered 2024-03-16: 80 mg via INTRA_ARTICULAR
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
## 2024-03-16 NOTE — Telephone Encounter (Signed)
 Updated xray order for [facility]

## 2024-03-16 NOTE — Telephone Encounter (Signed)
 Patient calls nurse line in regards to xray orders.   She reports she was seen at sports medicine this morning, however they were unable to perform the ordered images. She was under the impression sports medicine had the capability to do xray imaging.   Advised xray orders were placed by PCP yesterday and she can go anytime to the specified location.   It appears the imaging location is set for [facility]. Patient is requesting a closer [facility], [facility] [address].  Will forward to PCP to change.   Will coordinate with patient.

## 2024-03-16 NOTE — Addendum Note (Signed)
 Addended by: ORIE MILDA CROME on: 03/16/2024 10:50 AM   Modules accepted: Orders

## 2024-03-22 ENCOUNTER — Ambulatory Visit

## 2024-03-26 ENCOUNTER — Encounter (INDEPENDENT_AMBULATORY_CARE_PROVIDER_SITE_OTHER): Payer: Medicare Other | Admitting: Ophthalmology

## 2024-03-26 DIAGNOSIS — H442D1 Degenerative myopia with foveoschisis, right eye: Secondary | ICD-10-CM

## 2024-03-26 DIAGNOSIS — Z961 Presence of intraocular lens: Secondary | ICD-10-CM

## 2024-03-26 DIAGNOSIS — H35033 Hypertensive retinopathy, bilateral: Secondary | ICD-10-CM

## 2024-03-26 DIAGNOSIS — H35371 Puckering of macula, right eye: Secondary | ICD-10-CM

## 2024-03-26 DIAGNOSIS — I10 Essential (primary) hypertension: Secondary | ICD-10-CM

## 2024-04-10 ENCOUNTER — Ambulatory Visit: Admitting: Podiatry

## 2024-04-12 ENCOUNTER — Ambulatory Visit
